# Patient Record
Sex: Male | Born: 1955 | Race: White | Hispanic: No | Marital: Married | Smoking: Never smoker
Health system: Southern US, Community
[De-identification: ages and names within clinical notes are randomized; demographics above are authoritative.]

## PROBLEM LIST (undated history)

## (undated) DIAGNOSIS — N200 Calculus of kidney: Secondary | ICD-10-CM

## (undated) DIAGNOSIS — E119 Type 2 diabetes mellitus without complications: Secondary | ICD-10-CM

## (undated) HISTORY — PX: KNEE SURGERY: SHX244

## (undated) HISTORY — PX: HERNIA REPAIR: SHX51

## (undated) HISTORY — PX: CHOLECYSTECTOMY: SHX55

---

## 1998-01-02 ENCOUNTER — Encounter: Payer: Self-pay | Admitting: Emergency Medicine

## 1998-01-02 ENCOUNTER — Emergency Department (HOSPITAL_COMMUNITY): Admission: EM | Admit: 1998-01-02 | Discharge: 1998-01-02 | Payer: Self-pay | Admitting: Emergency Medicine

## 1998-01-05 ENCOUNTER — Ambulatory Visit (HOSPITAL_COMMUNITY): Admission: RE | Admit: 1998-01-05 | Discharge: 1998-01-06 | Payer: Self-pay | Admitting: General Surgery

## 2005-06-06 ENCOUNTER — Encounter: Payer: Self-pay | Admitting: General Surgery

## 2005-08-16 ENCOUNTER — Ambulatory Visit: Payer: Self-pay | Admitting: Internal Medicine

## 2005-08-30 ENCOUNTER — Ambulatory Visit: Payer: Self-pay | Admitting: Internal Medicine

## 2007-03-08 ENCOUNTER — Observation Stay (HOSPITAL_COMMUNITY): Admission: EM | Admit: 2007-03-08 | Discharge: 2007-03-08 | Payer: Self-pay | Admitting: Emergency Medicine

## 2010-06-20 NOTE — Discharge Summary (Signed)
NAME:  Timothy [name], Timothy [name] NO.:  [id number]   MEDICAL RECORD NO.:  [id number]          PATIENT TYPE:  INP   LOCATION:  3705                         FACILITY:  [facility]   PHYSICIAN:  Timothy [name], M.D.   DATE OF BIRTH:  [birth year]   DATE OF ADMISSION:  03/08/2007  DATE OF DISCHARGE:  03/08/2007                               DISCHARGE SUMMARY   A 55 year old patient who was admitted at 3:30 in the morning on March 08, 2007 for observation secondary to neck pain.  He had presented to  [facility] with neck pain.  The symptoms actually began on Thursday, March 06, 2007.  It awakened him at midnight, though, on March 08, 2007.  It  lasted to 10-15 minutes.  The neck pain abated spontaneously.  It again  happened lying down.  EMS was called.  Aspirin, sublingual nitroglycerin  given with relief of pain.  He was seen at [facility] on Friday and was  scheduled to see Dr. Nanetta [name] at [facility] on March 10, 2007  for complete evaluation.  He had no radiation with his discomfort.  No  shortness of breath, nausea, or diaphoresis.  He exercises regularly and  has noted increased shortness of breath with activity.  No other pain.   PAST MEDICAL HISTORY:  Negative.   FAMILY HISTORY:  Negative for coronary disease.   ALLERGIES:  NONE.   OUTPATIENT MEDICATIONS:  None.   SOCIAL HISTORY:  He is married.  His wife is a Web designer.  He has one son.  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  No tobacco, no alcohol use.   REVIEW OF SYSTEMS:  Except for neck pain, were negative.   PHYSICAL EXAMINATION AT DISCHARGE:  VITAL SIGNS:  Blood pressure 116/70,  pulse 75, respiratory rate 20, afebrile.  HEART:  S1 and S2.  Regular rate and rhythm without S3 gallop.  LUNGS:  Clear to auscultation bilaterally.  NEUROLOGIC:  Alert and oriented x3.  EXTREMITIES:  Without edema.   LABORATORY DATA:  Hemoglobin 15.9, hematocrit 46.08, platelets 294, WBC  6.7.   PTT 26, pro time 13.4, INR 1.  Cholesterol panel:  Total  cholesterol 220, triglycerides 132, HDL 55, LDL 139.  Sodium 139,  potassium 4.2, chloride 108, CO2 23, glucose 129, BUN 13, creatinine  0.92.  Bilirubin 0.8, alkaline phosphatase 49, SGOT 27, SGPT 33, calcium  8.8.  CK 117, MB 1.9, troponin I less than 0.01.  Initial markers were  all negative as well x2.  Magnesium 2.3.  TSH 0.995.  All negative for  MI.   Chest x-ray:  Results are not available currently.  EKG is without acute  changes, sinus rhythm.  He did have a first-degree block.  TR was 213  milliseconds.   HOSPITAL COURSE:  The patient was brought in for observation.  He was  kept overnight at [facility].  Cardiac enzymes were negative.  He did have  chest pain.  It had resolved, and he was stable at the time of  discharge.  Plans will be for Persantine Myoview  on Monday morning.   DISCHARGE MEDICATIONS:  Aspirin 325 mg daily.   DISCHARGE INSTRUCTIONS:  1. No work until March 11, 2007.  2. Increase activity slowly.  3. Low sodium, heart healthy diet.  4. See Dr. Allyson [name] on Monday.  The office will call the patient and      schedule the Persantine Myoview early morning on March 10, 2007.      He is to have nothing to drink after midnight on March 09, 2007      for test Monday.  No caffeine or milk products until after the      test.  The patient and wife aware of instructions.  The patient was      discharged home without further complications.      Timothy [name]. Timothy [name], N.P.      Timothy [name], M.D.  Electronically Signed    LRI/MEDQ  D:  03/08/2007  T:  03/09/2007  Job:  [id number]   cc:   Timothy [name], M.D.

## 2010-10-26 LAB — CBC
HCT: 47
Hemoglobin: 15.9
MCHC: 33.9
MCHC: 34.7
MCV: 88.1
MCV: 88.7
Platelets: 291
RBC: 5.31
RDW: 12.9
RDW: 13.1
WBC: 7.2

## 2010-10-26 LAB — POCT CARDIAC MARKERS
CKMB, poc: <1 — ABNORMAL LOW
CKMB, poc: <1 — ABNORMAL LOW
Myoglobin, poc: 69.1
Operator id: 282201
Troponin i, poc: <0.05

## 2010-10-26 LAB — I-STAT 8, (EC8 V) (CONVERTED LAB)
Acid-base deficit: 2
BUN: 16
Bicarbonate: 25.1 — ABNORMAL HIGH
Chloride: 108
Glucose, Bld: 122 — ABNORMAL HIGH
HCT: 48
Hemoglobin: 16.3
Operator id: 282201
Potassium: 4.8
Sodium: 140
TCO2: 27
pCO2, Ven: 48.6
pH, Ven: 7.322 — ABNORMAL HIGH

## 2010-10-26 LAB — COMPREHENSIVE METABOLIC PANEL
Alkaline Phosphatase: 49
BUN: 13
CO2: 23
Calcium: 8.8
GFR calc non Af Amer: >60
Glucose, Bld: 129 — ABNORMAL HIGH
Potassium: 4.2
Total Protein: 6.7

## 2010-10-26 LAB — CK TOTAL AND CKMB (NOT AT ARMC)
CK, MB: 1.9
Total CK: 117

## 2010-10-26 LAB — LIPID PANEL
HDL: 55
Total CHOL/HDL Ratio: 4
Triglycerides: 132
VLDL: 26

## 2010-10-26 LAB — DIFFERENTIAL
Basophils Relative: 1
Eosinophils Absolute: 0.1
Monocytes Relative: 4
Neutro Abs: 5.1
Neutrophils Relative %: 76

## 2010-10-26 LAB — PROTIME-INR
INR: 1
Prothrombin Time: 13.4

## 2010-10-26 LAB — POCT I-STAT CREATININE
Creatinine, Ser: 1.3
Operator id: 282201

## 2010-10-26 LAB — TSH: TSH: 0.995

## 2010-10-26 LAB — MAGNESIUM: Magnesium: 2.3

## 2012-09-12 ENCOUNTER — Emergency Department (HOSPITAL_BASED_OUTPATIENT_CLINIC_OR_DEPARTMENT_OTHER): Payer: BC Managed Care – PPO

## 2012-09-12 ENCOUNTER — Encounter (HOSPITAL_BASED_OUTPATIENT_CLINIC_OR_DEPARTMENT_OTHER): Payer: Self-pay | Admitting: Emergency Medicine

## 2012-09-12 ENCOUNTER — Emergency Department (HOSPITAL_BASED_OUTPATIENT_CLINIC_OR_DEPARTMENT_OTHER)
Admission: EM | Admit: 2012-09-12 | Discharge: 2012-09-13 | Disposition: A | Discharge status: Home / Self Care | Payer: BC Managed Care – PPO | Attending: Emergency Medicine | Admitting: Emergency Medicine

## 2012-09-12 DIAGNOSIS — R319 Hematuria, unspecified: Secondary | ICD-10-CM | POA: Insufficient documentation

## 2012-09-12 DIAGNOSIS — N1330 Unspecified hydronephrosis: Secondary | ICD-10-CM

## 2012-09-12 DIAGNOSIS — R112 Nausea with vomiting, unspecified: Secondary | ICD-10-CM | POA: Insufficient documentation

## 2012-09-12 DIAGNOSIS — N200 Calculus of kidney: Secondary | ICD-10-CM

## 2012-09-12 HISTORY — DX: Calculus of kidney: N20.0

## 2012-09-12 LAB — CBC WITH DIFFERENTIAL/PLATELET
Basophils Absolute: 0.1 10*3/uL (ref 0.0–0.1)
Basophils Relative: 1 % (ref 0–1)
Eosinophils Relative: 4 % (ref 0–5)
HCT: 46.8 % (ref 39.0–52.0)
Hemoglobin: 16.2 g/dL (ref 13.0–17.0)
MCH: 29.6 pg (ref 26.0–34.0)
MCHC: 34.6 g/dL (ref 30.0–36.0)
MCV: 85.4 fL (ref 78.0–100.0)
Monocytes Absolute: 0.7 10*3/uL (ref 0.1–1.0)
Monocytes Relative: 10 % (ref 3–12)
Neutro Abs: 4.4 10*3/uL (ref 1.7–7.7)
RDW: 12.4 % (ref 11.5–15.5)

## 2012-09-12 LAB — COMPREHENSIVE METABOLIC PANEL
AST: 18 U/L (ref 0–37)
Albumin: 4 g/dL (ref 3.5–5.2)
BUN: 18 mg/dL (ref 6–23)
Calcium: 9.7 mg/dL (ref 8.4–10.5)
Chloride: 100 mEq/L (ref 96–112)
Creatinine, Ser: 1.1 mg/dL (ref 0.50–1.35)
GFR calc non Af Amer: 73 mL/min — ABNORMAL LOW (ref >90)
Total Bilirubin: 0.3 mg/dL (ref 0.3–1.2)

## 2012-09-12 LAB — URINALYSIS, ROUTINE W REFLEX MICROSCOPIC
Glucose, UA: NEGATIVE mg/dL
Ketones, ur: NEGATIVE mg/dL
Leukocytes, UA: NEGATIVE
Nitrite: NEGATIVE
Protein, ur: NEGATIVE mg/dL
Urobilinogen, UA: 0.2 mg/dL (ref 0.0–1.0)

## 2012-09-12 LAB — URINE MICROSCOPIC-ADD ON

## 2012-09-12 MED ORDER — ONDANSETRON HCL 4 MG/2ML IJ SOLN
4.0000 mg | Freq: Once | INTRAMUSCULAR | Status: AC
  Administered 2012-09-12: 4 mg via INTRAVENOUS
  Filled 2012-09-12: qty 2

## 2012-09-12 MED ORDER — HYDROMORPHONE HCL PF 1 MG/ML IJ SOLN
1.0000 mg | Freq: Once | INTRAMUSCULAR | Status: AC
  Administered 2012-09-12: 1 mg via INTRAVENOUS
  Filled 2012-09-12: qty 1

## 2012-09-12 NOTE — ED Notes (Signed)
Severe right flank pain x3 hrs.  Sts he has known it was coming on for 2-3 weeks.  Sts he has had 40-50 kidney stones and generally passes it himself.  Occasionally he has one "like this and I need some help."    Nausea and vomiting.  Unable to sit.

## 2012-09-12 NOTE — ED Notes (Signed)
Pt states has multiple kidney stones every year. States currently 10/10 pain, vomited x3 today.

## 2012-09-12 NOTE — ED Provider Notes (Signed)
CSN: [id number]     Arrival date & time 09/12/12  2120 History     First MD Initiated Contact with Patient 09/12/12 2152     Chief Complaint  Patient presents with  . Flank Pain   (Consider location/radiation/quality/duration/timing/severity/associated sxs/prior Treatment) The history is provided by the patient and the spouse. No language interpreter was used.  Timothy [name] is a 57 y/o M with PMHx of kidney stones presenting to the emergency department with right-sided flank pain has been ongoing for the past 2-3 weeks, but severe pain began approximately 3 hours ago patient described discomfort to be a deep high-level pain starting at the right flank and radiating to the abdomen towards the suprapubic region into the groin. Stated that he knew this pain was coming for a couple weeks, reported that he has seen blood in his urine which is a sign that he is passing kidney stones-as per patient. Patient reported that certain positions make the pain worse, stated that laying on his back flat makes the pain better. Patient reported that he was taking OxyContin at home with negative relief. Patient reported that he has been feeling nauseous, stated he had at least 7 episodes of emesis today-mainly of food and liquids, non-bloody and non-bilious. Patient reported that he has had a history of kidney stones since he was 57 years old. Stated that he was seen by urology many years ago, and denied any surgeries for kidney stones. Stated that he had a cystoscopy performed many years ago, when he was in his 68's, stated that there were negative findings noted denied fever, chills, neck pain, back pain, diarrhea, headache, dizziness, numbness, tingling.  Past Medical History  Diagnosis Date  . Kidney stones    Past Surgical History  Procedure Laterality Date  . Cholecystectomy    . Hernia repair    . Knee surgery     No family history on file. History  Substance Use Topics  . Smoking status: Never Smoker    . Smokeless tobacco: Never Used  . Alcohol Use: Yes     Comment: occ    Review of Systems  Constitutional: Negative for fever and chills.  HENT: Negative for neck pain and neck stiffness.   Respiratory: Negative for cough, chest tightness and shortness of breath.   Cardiovascular: Negative for chest pain.  Gastrointestinal: Positive for nausea and vomiting. Negative for abdominal pain.  Genitourinary: Positive for hematuria and flank pain (right sided). Negative for difficulty urinating.  Neurological: Negative for dizziness, weakness, numbness and headaches.  All other systems reviewed and are negative.    Allergies  Review of patient's allergies indicates no known allergies.  Home Medications   Current Outpatient Rx  Name  Route  Sig  Dispense  Refill  . ondansetron (ZOFRAN) 4 MG tablet   Oral   Take 1 tablet (4 mg total) by mouth every 6 (six) hours.   12 tablet   0   . oxyCODONE-acetaminophen (PERCOCET) 5-325 MG per tablet   Oral   Take 1 tablet by mouth every 8 (eight) hours as needed for pain.   11 tablet   0    BP 120/67  Pulse 81  Temp(Src) 97.7 F (36.5 C) (Oral)  Resp 16  Ht 5\' 10"  (1.778 m)  Wt 225 lb (102.059 kg)  BMI 32.28 kg/m2  SpO2 95% Physical Exam  Nursing note and vitals reviewed. Constitutional: He is oriented to person, place, and time. He appears well-developed and well-nourished. No distress.  HENT:  Head: Normocephalic and atraumatic.  Mouth/Throat: Oropharynx is clear and moist. No oropharyngeal exudate.  Eyes: Conjunctivae and EOM are normal. Pupils are equal, round, and reactive to light. Right eye exhibits no discharge. Left eye exhibits no discharge.  Neck: Normal range of motion. Neck supple.  Negative neck stiffness Negative nuchal rigidity  Cardiovascular: Normal rate, regular rhythm and normal heart sounds.  Exam reveals no friction rub.   No murmur heard. Pulses:      Radial pulses are 2+ on the right side, and 2+ on the  left side.       Dorsalis pedis pulses are 2+ on the right side, and 2+ on the left side.  Pulmonary/Chest: Effort normal and breath sounds normal. No respiratory distress. He has no wheezes. He has no rales.  Abdominal: Soft. Bowel sounds are normal. He exhibits no distension. There is tenderness (suprapubic pressure). There is no rebound and no guarding.  Right sided CVA tenderness  Lymphadenopathy:    He has no cervical adenopathy.  Neurological: He is alert and oriented to person, place, and time. He exhibits normal muscle tone. Coordination normal.  Skin: Skin is warm and dry. No rash noted. He is not diaphoretic. No erythema.  Psychiatric: He has a normal mood and affect. His behavior is normal. Thought content normal.    ED Course   Procedures (including critical care time)  Upon arrival to the emergency department, patient was reported to be in renal colic. Nurse came over to me and reported the patient is in intense pain, 10 out of 10, reported the patient was crying do to discomfort. Pain medications ordered and administered for pain relief.  Medications  HYDROmorphone (DILAUDID) injection 1 mg (1 mg Intravenous Given 09/12/12 2202)  ondansetron (ZOFRAN) injection 4 mg (4 mg Intravenous Given 09/12/12 2202)  HYDROmorphone (DILAUDID) injection 1 mg (1 mg Intravenous Given 09/13/12 0026)  ondansetron (ZOFRAN) injection 4 mg (4 mg Intravenous Given 09/13/12 0024)    Labs Reviewed  URINALYSIS, ROUTINE W REFLEX MICROSCOPIC - Abnormal; Notable for the following:    Hgb urine dipstick LARGE (*)    All other components within normal limits  COMPREHENSIVE METABOLIC PANEL - Abnormal; Notable for the following:    Glucose, Bld 186 (*)    GFR calc non Af Amer 73 (*)    GFR calc Af Amer 85 (*)    All other components within normal limits  URINE MICROSCOPIC-ADD ON  CBC WITH DIFFERENTIAL   Ct Abdomen Pelvis Wo Contrast  09/12/2012   *RADIOLOGY REPORT*  Clinical Data: Severe right flank pain  for 3 hours.  Nausea and vomiting.  CT ABDOMEN AND PELVIS WITHOUT CONTRAST  Technique:  Multidetector CT imaging of the abdomen and pelvis was performed following the standard protocol without intravenous contrast.  Comparison: CT of the abdomen and pelvis performed 06/28/2005  Findings: Minimal bibasilar atelectasis is noted.  The liver and spleen are unremarkable in appearance.  The gallbladder is within normal limits.  The pancreas and adrenal glands are unremarkable.  There is mild right-sided hydronephrosis, with prominence of the right ureter to the level of an obstructing 5 x 4 mm stone in the mid right ureter.  Right-sided perinephric stranding and fluid are seen.  Additional scattered small left-sided renal stones are seen, measuring up to 4 mm in size.  The left kidney is otherwise unremarkable in appearance.  No free fluid is identified.  The small bowel is unremarkable in appearance.  The stomach is within  normal limits.  No acute vascular abnormalities are seen.  The appendix is normal in caliber and contains air, without evidence for appendicitis.  The colon is unremarkable in appearance.  The bladder is mildly distended and grossly unremarkable.  The prostate remains normal in size, with minimal calcification.  Small bilateral inguinal hernias are seen, containing only fat.  No inguinal lymphadenopathy is seen.  No acute osseous abnormalities are identified.  IMPRESSION:  1.  Mild right-sided hydronephrosis, with an obstructing 5 x 4 mm stone in the mid right ureter. 2.  Scattered small left-sided nonobstructing renal stones seen, measuring up to 4 mm in size. 3.  Small bilateral inguinal hernias, containing only fat.   Original Report Authenticated By: Tonia Ghent, M.D.   1. Nephrolithiasis   2. Hydronephrosis, right     MDM  Patient presenting to the emergency department with right-sided flank pain. Patient has a strong history of kidney stones, is not currently being seen by a urologist.  Denied any form of surgical procedures. Upon arrival to the emergency department patient was in renal colic with intense nausea and at least one episode of emesis in the emergency department-patient was unable to sit still. Negative acute abdomen, negative peritoneal sign mild discomfort upon palpation to the suprapubic region. Negative CVA tenderness, bilaterally. Patient calm and collected during physical exam. CBC negative findings, negative elevation in WBC. CMP negative findings. Urinalysis positive for large hemoglobin noted-negative elevation in WBC, doubt pyelonephritis. CT abdomen performed-obstructing stone in the mid right ureter with mild hydronephrosis noted, scattered small nonobstructing renal stones in the left kidney noted. Pain controlled in ED setting, patient rated pain 2/10 prior to discharge-patient calm, laying down relaxed in bed. Patient stable, afebrile-no elevation in WBC, patient is not septic. Patient discharged. Discharge patient with anti-emetics and pain medication-discussed with patient precautions, course, disposal technique. Referred patient to urology, instructed patient to contact urology first thing Monday morning-distress this importance. Discussed with the patient to rest and stay hydrated. Patient to closely monitor symptoms, instructed that if pain is to increase in any way increase in nausea and vomiting patient is to return to the emergency department immediately. Discussed with patient to continue to monitor symptoms closely and if symptoms are to worsen or change to report back to the emergency department-strict return instructions given. Patient agreed to plan of care, understood, all questions answered.  Raymon Mutton, PA-C 09/13/12 0206

## 2012-09-13 MED ORDER — OXYCODONE-ACETAMINOPHEN 5-325 MG PO TABS: 1.0000 | ORAL_TABLET | Freq: Three times a day (TID) | ORAL | Status: DC | PRN

## 2012-09-13 MED ORDER — ONDANSETRON HCL 4 MG PO TABS: 4.0000 mg | ORAL_TABLET | Freq: Four times a day (QID) | ORAL | Status: DC

## 2012-09-13 MED ORDER — HYDROMORPHONE HCL PF 1 MG/ML IJ SOLN
1.0000 mg | Freq: Once | INTRAMUSCULAR | Status: AC
  Administered 2012-09-13: 1 mg via INTRAVENOUS
  Filled 2012-09-13: qty 1

## 2012-09-13 MED ORDER — ONDANSETRON HCL 4 MG/2ML IJ SOLN
4.0000 mg | Freq: Once | INTRAMUSCULAR | Status: AC
  Administered 2012-09-13: 4 mg via INTRAVENOUS
  Filled 2012-09-13: qty 2

## 2012-09-13 NOTE — ED Notes (Signed)
PA at bedside giving test results and detailed plan of care.

## 2012-09-17 NOTE — ED Provider Notes (Signed)
Shared service with midlevel provider. I have personally seen and examined the patient, providing direct face to face care, presenting with the chief complaint of abdominal pain. Physical exam findings include non peritoneal abdomen. Pt has a non obstructive ureteral stone, it is large. Pain controlled. Plan will be to get urgent f/u, return precautions discussed by the team. I have reviewed the nursing documentation on past medical history, family history, and social history.  Derwood Kaplan, MD 09/17/12 1217

## 2012-11-17 ENCOUNTER — Ambulatory Visit (INDEPENDENT_AMBULATORY_CARE_PROVIDER_SITE_OTHER): Payer: BC Managed Care – PPO | Admitting: Sports Medicine

## 2012-11-17 ENCOUNTER — Encounter (INDEPENDENT_AMBULATORY_CARE_PROVIDER_SITE_OTHER): Payer: Self-pay

## 2012-11-17 ENCOUNTER — Encounter: Payer: Self-pay | Admitting: Sports Medicine

## 2012-11-17 VITALS — BP 140/90 | Ht 70.0 in | Wt 220.0 lb

## 2012-11-17 DIAGNOSIS — M7660 Achilles tendinitis, unspecified leg: Secondary | ICD-10-CM

## 2012-11-17 MED ORDER — NITROGLYCERIN 0.2 MG/HR TD PT24: MEDICATED_PATCH | TRANSDERMAL | Status: DC

## 2012-11-17 NOTE — Patient Instructions (Signed)
Nitroglycerin Protocol   Apply 1/4 nitroglycerin patch to affected area daily.  Change position of patch within the affected area every 24 hours.  You may experience a headache during the first 1-2 weeks of using the patch, these should subside.  If you experience headaches after beginning nitroglycerin patch treatment, you may take your preferred over the counter pain reliever.  Another side effect of the nitroglycerin patch is skin irritation or rash related to patch adhesive.  Please notify our office if you develop more severe headaches or rash, and stop the patch.  Tendon healing with nitroglycerin patch may require 12 to 24 weeks depending on the extent of injury.  Men should not use if taking Viagra, Cialis, or Levitra.   Do not use if you have migraines or rosacea.   Please follow up in 6 weeks  Thank you for seeing [place] today!

## 2012-11-18 NOTE — Progress Notes (Signed)
  Subjective:    Patient ID: Timothy [name], male    DOB: Jul 14, 1955, 57 y.o.   MRN: [id number]  HPI chief complaint: Right Achilles pain  Very pleasant 57 year old male comes in today complaining of 4 months of right Achilles pain and swelling. His symptoms began acutely while on a hike. He does not recall a pop and has been able to ambulate but has been experiencing intermittent pain and swelling ever since particularly with activity such as climbing or hiking. He localizes all of his pain to the posterior ankle at the Achilles tendon. Some radiating discomfort into the calf as well. Similar pain in the left Achilles but not nearly as pronounced as what he's experiencing in the right. No problems with his Achilles in the past. He has been taking intermittent doses of over-the-counter anti-inflammatories. His swelling is intermittent and worse with activity. No numbness and tingling. Symptoms improve at rest. No prior foot or ankle surgeries.  Medical history and current medications are reviewed No known drug allergies He does not smoke and drinks alcohol on occasional basis.    Review of Systems     Objective:   Physical Exam Well-developed, well-nourished. No acute distress. Vital signs are reviewed. Awake alert and oriented x3  Right ankle: There is tenderness to palpation and as well as mild swelling along the Achilles tendon just proximal to its insertion on the calcaneus. No palpable defect. Excellent plantar flexion with Thompson's testing. Good strength with resisted plantarflexion. No real pain with Achilles stretch. No tenderness to palpation along his calf. Ankle is stable with no effusion. Neurovascularly intact distally.  Left ankle: Mild tenderness to palpation along the Achilles tendon but not markedly. No palpable swelling. No palpable defect. Excellent plantar flexion with Thompson's testing. Good strength with resisted plantarflexion. Neurovascularly intact distally.  Patient  is walking without a significant limp.  MSK ultrasound of each Achilles tendon was performed. Images were obtained in both long and short view. The right Achilles tendon shows marked hypoechogenicity at the Achilles tendon proximal to the insertion on the calcaneus. This is appreciated both the long and short view. I believe that there is some longitudinal tearing of the tendon. Poor neovascularity. No significant spur formation or calcific tendinopathy is seen. Achilles tendon measures 0.88cm in the [place]. Left Achilles tendon shows some hypoechoic changes along the tendon sheath consistent with some inflammation here but I do not see any definitive tearing. It measures 0.78cm on the [place].       Assessment & Plan:  Bilateral Achilles pain secondary to Achilles tendinopathy, right greater than left  Patient will start the topical nitroglycerin protocol applying one quarter patch daily to the more symptomatic right Achilles. I've also given him a body helix compression sleeve for the right ankle and I've given him bilateral 5/16 inch lift for both feet. He will start an eccentric heel drop home exercise program and followup with me in 6 weeks for reevaluation and repeat ultrasound. He is cautioned about hiking and climbing exacerbating his symptoms. He will call with questions or concerns prior to his followup visit.

## 2012-12-11 ENCOUNTER — Other Ambulatory Visit: Payer: Self-pay

## 2012-12-22 ENCOUNTER — Encounter: Payer: Self-pay | Admitting: Sports Medicine

## 2012-12-22 ENCOUNTER — Ambulatory Visit (INDEPENDENT_AMBULATORY_CARE_PROVIDER_SITE_OTHER): Payer: BC Managed Care – PPO | Admitting: Sports Medicine

## 2012-12-22 VITALS — BP 150/90 | Ht 70.0 in | Wt 220.0 lb

## 2012-12-22 DIAGNOSIS — M7660 Achilles tendinitis, unspecified leg: Secondary | ICD-10-CM

## 2012-12-22 MED ORDER — NITROGLYCERIN 0.2 MG/HR TD PT24: MEDICATED_PATCH | TRANSDERMAL | Status: DC

## 2012-12-22 NOTE — Progress Notes (Signed)
  Subjective:    Patient ID: Timothy [name], male    DOB: 09-25-1955, 57 y.o.   MRN: [id number]  HPI Patient is a 57 yo male who presents in follow-up for bilateral achilles tendonitis. Last seen 11/18/12 for this issue. Notes he has been improving on the nitro patches with use on the right side. He states he is stiff in the mornings, though feels improved as the day progresses. Has been tolerating the nitro patches and is doing the exercises provided at the last visit. Notes he feels 30-40% improved. Now notes that his left achilles may feel worse than the right. He is doing 3-5 miles of walking a day.    Review of Systems see HPI     Objective:   Physical Exam Well nourished, well developed  Right ankle: mild swelling noted in the distal achilles prior to the insertion site, there is point tenderness in this area as well, he exhibits full range of motion in ankle, no tenderness along his calf, 5/5 strength on plantar flexion, dorsiflexion, inversion, and eversion, neurovascularly intact, no apparent effusion  Left ankle: no apparent swelling noted in the distal achilles prior to the insertion site, there is point tenderness in this area though, he exhibits full range of motion in ankle, no tenderness along his calf, 5/5 strength on plantar flexion, dorsiflexion, inversion, and eversion, neurovascularly intact, no apparent effusion  MSK ultrasound of bilateral achilles tendons were performed. Images obtained in both long and short views. The right achilles tendon was noted to have a thickness of 0.92 cm at the distal portion of the achilles prior to the insertion site, there is an area of hypoechogenicity in the area of thickening, tearing is less apparent compared to previous ultrasound. Left achilles tendon shows hypoechoic changes with thickness of 0.82 cm in the distal portion prior to the insertion at the calcaneus, in the short view there are a few small areas of tearing.     Assessment &  Plan:  Patient with bilateral achilles tendonopathy  Patient to continue topical nitroglycerin protocol applying 1/4 patch to his right achilles daily. In addition he is to start this protocol on the left as well. He is to continue his eccentric exercises and to use the heel lifts for his shoes. Discussed that hiking and walking could exacerbate his symptoms. Patient is to follow-up right before the holidays as he is heading out of town for a month and a half right after the holidays.

## 2012-12-22 NOTE — Patient Instructions (Signed)
Nice to see you. Please use 1/4 of a nitroglycerin patch on both your achilles tendons. Continue to do your exercises. We will see you back right before the holidays.

## 2013-01-22 ENCOUNTER — Ambulatory Visit: Payer: BC Managed Care – PPO | Admitting: Sports Medicine

## 2013-02-18 ENCOUNTER — Encounter: Payer: Self-pay | Admitting: Sports Medicine

## 2013-02-18 ENCOUNTER — Ambulatory Visit (INDEPENDENT_AMBULATORY_CARE_PROVIDER_SITE_OTHER): Payer: BC Managed Care – PPO | Admitting: Sports Medicine

## 2013-02-18 VITALS — BP 145/90 | HR 71 | Ht 70.0 in | Wt 220.0 lb

## 2013-02-18 DIAGNOSIS — M7660 Achilles tendinitis, unspecified leg: Secondary | ICD-10-CM

## 2013-02-18 DIAGNOSIS — M7662 Achilles tendinitis, left leg: Secondary | ICD-10-CM

## 2013-02-18 DIAGNOSIS — M7661 Achilles tendinitis, right leg: Secondary | ICD-10-CM

## 2013-02-18 NOTE — Progress Notes (Signed)
   Subjective:    Patient ID: Timothy [name], male    DOB: 12/16/1955, 58 y.o.   MRN: [id number]  HPI Patient comes in today for followup on bilateral Achilles tendinopathy. Overall, he continues to improve. Left heel is practically asymptomatic. Right heel is improving. Still some stiffness in the morning but he has returned to all activities including hiking. He continues to use his nitroglycerin on both Achilles tendons. Tolerating this fairly well. Overall, he is pleased with his progress to date. Unfortunately, his mother recently passed away unexpectedly and he admittedly was unable to do his exercises or use his nitroglycerin but he has now resumed both of these.    Review of Systems     Objective:   Physical Exam Well-developed, no acute distress  Right heel: There is still some palpable thickening at the mid substance of the Achilles tendon but it is nontender to palpation. Left heel: Mild thickening of the Achilles tendon but again no palpable tenderness. Walking without a limp.  MSK ultrasound: Quick scan of each Achilles tendon still shows some thickening of each tendon, right greater than left. Overall, measuring a little less in thickness than on his previous scans.       Assessment & Plan:  Improving bilateral Achilles tendinopathy  Continue with topical nitroglycerin protocol on both Achilles tendons. Continue with eccentric heel drops. He is going to take a job at [employer] in May and I like to see him back in the office prior to his trip. We will repeat his ultrasound at that time. Although he continues to show thickening of the tendon, he is clinically much improved.  Patient is now about 3 months into treatment

## 2014-02-16 ENCOUNTER — Ambulatory Visit (INDEPENDENT_AMBULATORY_CARE_PROVIDER_SITE_OTHER): Payer: BLUE CROSS/BLUE SHIELD | Admitting: Sports Medicine

## 2014-02-16 ENCOUNTER — Encounter: Payer: Self-pay | Admitting: Sports Medicine

## 2014-02-16 VITALS — BP 131/86 | HR 73 | Ht 70.0 in | Wt 210.0 lb

## 2014-02-16 DIAGNOSIS — M7661 Achilles tendinitis, right leg: Secondary | ICD-10-CM | POA: Diagnosis not present

## 2014-02-16 DIAGNOSIS — M7662 Achilles tendinitis, left leg: Secondary | ICD-10-CM

## 2014-02-16 NOTE — Progress Notes (Signed)
Subjective:    Patient ID: Timothy [name], male    DOB: 06/22/1955, 59 y.o.   MRN: [id number]  HPI  Timothy [name] is a 59 yo male who presents with recurrent, bilateral achilles tenderness. He was last seen approximately one year ago for bilateral Achilles tendinopathy. At that time, he completed a three month course of topical nitroglycerin and heel drop exercises. This improved his pain. Shortly after this, he went out to the [employer] for six months where he worked for the [employer] During this time, he was walking 5-10 miles per day. Three months ago he resumed  the heel drop exercises when his pain started to return. He has continued to have bilateral pain approximately 1 cm above the Achilles insertion into the calcaneus, worse on the right. His right ankle has also been swelling around the Achilles and along the posterior border of the medial malleolus. Yesterday, he also reports having some associated crampy, sharp right calf pain while walking his dog. No numbness or tingling. No acute, specific injuries or associated discoloration.   Timothy [name] also describes bilateral shoulder pain and knee pain in conjunction with his ankle pain. His wife has been diagnosed with Lyme disease. Timothy [name] reports that all of this joint pain seems to have started around the same time and is concerned that he may have had an exposure. His wife sees a physician in [place], [place] for her Lyme disease and Timothy [name] plans to discuss this with him at their next appointment. He declined any further evaluation in clinic today.   Review of Systems Negative other than noted in HPI.     Objective:   Physical Exam Vitals: BP 131/86 P 73 General: well-appearing, pleasant male in no acute distress  Right Ankle:  Inspection: Increased Achilles thickness approximately 1 cm superior to the Achilles/Calcaneal insertion, mild swelling between the medial border of the Achilles and the posterior border of the medial  malleolus, no discoloration Palpation: tender to palpation approximately 1 cm superior to the Achilles/Calcaneal insertion. No tenderness to palpation along the medial or lateral malleoli.   ROM: full ROM in all planes Strength: 5/5 with flexion, extension, eversion, and inversion Special Testing: neg anterior drawer test, mild amount of laxity with talar tilt testing Neurovascular: neurovascularly intact distally  Left Ankle:  Inspection: Increased Achilles thickness along the Achilles/Calcaneal insertion, no swelling or discoloration Palpation: tender to palpation approximately 1 cm superior to the Achilles/Calcaneal insertion. No tenderness to palpation along the medial or lateral malleoli.   ROM: full ROM in all planes Strength: 5/5 with flexion, extension, eversion, and inversion Special Testing: neg anterior drawer test, neg talar tilt testing Neurovascular: neurovascularly intact distally  Ultrasound Examination:  Right Achilles tendon viewed along long and short axis. Right Achilles tendon continues to be thickened, measuring 0.96cm on long axis view. No obvious tears.    Assessment & Plan:   1. Bilateral Achilles Tendinopathy:  Given Timothy [name] high level of activity and previous Achilles Tendinopathy with Achilles tendon thickening, this seems to be an acute on chronic exacerbation. The Achilles Tendinopathy has likely acutely flared causing the pain.   - Discussed continuing the heel drop exercises - Restarted topical nitroglycerin protocol - Will follow-up in 4 weeks  2. Sharp, crampy calf pain:  Timothy [name] has had heel lifts in his shoes in the past and is currently using an OTC support. This coupled with the restarted of his heel drop exercises may have initiated this  pain.   - Provided pt with 5/16" heel lifts (2 sets) - Will follow-up in 4 weeks   This patient was seen with and note was dictated by Gorden HarmsMegan Campbell, MS4 [facility]

## 2014-02-23 ENCOUNTER — Other Ambulatory Visit: Payer: Self-pay | Admitting: *Deleted

## 2014-02-23 MED ORDER — NITROGLYCERIN 0.2 MG/HR TD PT24: MEDICATED_PATCH | TRANSDERMAL | Status: DC

## 2015-07-26 ENCOUNTER — Encounter: Payer: Self-pay | Admitting: Gastroenterology

## 2015-12-15 DIAGNOSIS — L02821 Furuncle of head [any part, except face]: Secondary | ICD-10-CM | POA: Diagnosis not present

## 2015-12-15 DIAGNOSIS — B9689 Other specified bacterial agents as the cause of diseases classified elsewhere: Secondary | ICD-10-CM | POA: Diagnosis not present

## 2016-02-13 DIAGNOSIS — E119 Type 2 diabetes mellitus without complications: Secondary | ICD-10-CM | POA: Diagnosis not present

## 2016-02-13 DIAGNOSIS — D3131 Benign neoplasm of right choroid: Secondary | ICD-10-CM | POA: Diagnosis not present

## 2016-02-13 DIAGNOSIS — H2513 Age-related nuclear cataract, bilateral: Secondary | ICD-10-CM | POA: Diagnosis not present

## 2016-02-13 DIAGNOSIS — Z7984 Long term (current) use of oral hypoglycemic drugs: Secondary | ICD-10-CM | POA: Diagnosis not present

## 2016-04-11 DIAGNOSIS — Z1211 Encounter for screening for malignant neoplasm of colon: Secondary | ICD-10-CM | POA: Diagnosis not present

## 2016-04-11 DIAGNOSIS — E119 Type 2 diabetes mellitus without complications: Secondary | ICD-10-CM | POA: Diagnosis not present

## 2016-12-10 ENCOUNTER — Ambulatory Visit: Payer: Self-pay

## 2016-12-10 ENCOUNTER — Ambulatory Visit: Payer: BLUE CROSS/BLUE SHIELD | Admitting: Sports Medicine

## 2016-12-10 ENCOUNTER — Encounter: Payer: Self-pay | Admitting: Sports Medicine

## 2016-12-10 VITALS — BP 140/88 | HR 71 | Ht 70.0 in | Wt 220.4 lb

## 2016-12-10 DIAGNOSIS — Z23 Encounter for immunization: Secondary | ICD-10-CM | POA: Diagnosis not present

## 2016-12-10 DIAGNOSIS — M7662 Achilles tendinitis, left leg: Secondary | ICD-10-CM

## 2016-12-10 DIAGNOSIS — M7661 Achilles tendinitis, right leg: Secondary | ICD-10-CM | POA: Diagnosis not present

## 2016-12-10 DIAGNOSIS — M6788 Other specified disorders of synovium and tendon, other site: Secondary | ICD-10-CM

## 2016-12-10 MED ORDER — NITROGLYCERIN 0.2 MG/HR TD PT24: MEDICATED_PATCH | TRANSDERMAL | 1 refills | Status: DC

## 2016-12-10 NOTE — Progress Notes (Signed)
OFFICE VISIT NOTE Timothy [name]. [name] at [facility] [phone]  Timothy [name] - 61 y.o. male MRN [id number]  Date of birth: 07-May-1955  Visit Date: 12/10/2016  PCP: Patient, No Pcp Per   Referred by: No ref. provider found  Fabio Pierce PT, LAT, ATC acting as scribe for Dr. Berline Chough.  SUBJECTIVE:   Chief Complaint  Patient presents with  . New Patient (Initial Visit)    B Achille's pain   HPI: As below and per problem based documentation when appropriate.  Timothy [name] is a new pt presenting w/ c/o B Achille's pain, L>R.  Pt had a similar issue about one year ago and reports having been prescribed eccentric calf exercises and nitro patches which did give some relief.  Pt states that he is retired and goes out of town in the summers when he does a lot of hiking at [employer] parks but feels like he has less issue when he's away from home in [place] and is wondering the decreased temps and humidity makes a difference.  He states that it "balls up" at night and then loosens up throughout the day.  He reports having pain and swelling along the Achille's tendon (more mid-tendon) and rates that the pain is an 7-8/10 at it's worst and 0/10 at it's best.  He is an avid hiker.  He states that he is no longer using the nitro patches and stopped using them about 1.5 years ago after using them consistently for one year after experiencing headaches.  Pt states that his symptoms definitely worsen after hiking and that he uses ice, IBU and rest to help decrease his symptoms but to little avail.  He states that he has been to see several different doctors for the same condition over the past few years including Dr. Darrick Penna.    Review of Systems  Constitutional: Negative for chills, fever and weight loss.  HENT: Negative.   Eyes: Negative.   Respiratory: Negative for cough, shortness of breath and wheezing.   Cardiovascular: Negative for chest pain and  palpitations.  Gastrointestinal: Negative for abdominal pain, heartburn and nausea.  Musculoskeletal: Positive for joint pain. Negative for falls.  Neurological: Negative for dizziness, tingling and headaches.  Endo/Heme/Allergies: Does not bruise/bleed easily.  Psychiatric/Behavioral: Negative for depression. The patient is not nervous/anxious and does not have insomnia.     Otherwise per HPI.  HISTORY & PERTINENT PRIOR DATA:  No specialty comments available. He reports that  has never smoked. he has never used smokeless tobacco. No results for input(s): HGBA1C, LABURIC, CREATINE in the last 8760 hours.  Invalid input(s): CR Allergies reviewed per EMR Prior to Admission medications   Medication Sig Start Date End Date Taking? Authorizing Provider  metFORMIN (GLUCOPHAGE) 500 MG tablet Take 500 mg 1 day or 1 dose by mouth.   Yes [provider]  nitroGLYCERIN (NITRODUR - DOSED IN MG/24 HR) 0.2 mg/hr patch Place 1/4 to 1/2 of a patch over affected region. Remove and replace once daily.  Slightly alter skin placement daily 12/10/16   Andrena Mews, DO   Patient Active Problem List   Diagnosis Date Noted  . Bilateral Achilles tendonosis 12/10/2016   Past Medical History:  Diagnosis Date  . Kidney stones    History reviewed. No pertinent family history. Past Surgical History:  Procedure Laterality Date  . CHOLECYSTECTOMY    . HERNIA REPAIR    . KNEE SURGERY  Social History   Occupational History  . Not on file  Tobacco Use  . Smoking status: Never Smoker  . Smokeless tobacco: Never Used  Substance and Sexual Activity  . Alcohol use: Yes    Comment: occ  . Drug use: No  . Sexual activity: Not on file    OBJECTIVE:  VS:  HT:5\' 10"  (177.8 cm)   WT:220 lb 6.4 oz (100 kg)  BMI:31.62    BP:140/88  HR:71bpm  TEMP: ( )  RESP:95 % EXAM: Findings:  WDWN, NAD, Non-toxic appearing Alert & appropriately interactive Not depressed or anxious appearing No  increased work of breathing. Pupils are equal. EOM intact without nystagmus No clubbing or cyanosis of the extremities appreciated No significant rashes/lesions/ulcerations overlying the examined area. DP & PT pulses 2+/4.  No significant pretibial edema. Sensation intact to light touch in lower extremities.  Bilateral ankles: Overall well aligned.  He has a small amount of swelling over the bilateral avascular zone of the Achilles that is mildly tender on the left and nontender on the right.  He has slight equinus contracture to 95 degrees on the left and 100 degrees on the right.  Ankle exam is otherwise stable.  He does have a moderately high cavus foot that is rigid.    RADIOLOGY: [place] LIMITED JOINT SPACE STRUCTURES LOW BILAT(NO LINKED CHARGES) Andrena Mews, DO     12/10/2016 10:35 AM LIMITED MSK ULTRASOUND OF Bilateral Achilles Images were obtained and interpreted by myself, Gaspar Bidding, DO   Images have been saved and stored to PACS system. Images obtained on: GE S7 Ultrasound machine  FINDINGS:   Fusiform swelling of the bilateral Achilles with normal  Left is measuring 0.91 cm  Right is measuring 0.89 cm.  There is marked hypoechoic change without neovascularity  bilaterally.  IMPRESSION:  1. Bilateral Achilles tendinosis, left worse than right  ASSESSMENT & PLAN:     ICD-10-CM   1. Bilateral Achilles tendonosis M76.61 [place] LIMITED JOINT SPACE STRUCTURES LOW BILAT(NO LINKED CHARGES)   M76.62    ================================================================= Bilateral Achilles tendonosis Marked persistent thickening consistent with Achilles tendinosis that is chronic.  He has previously responded well to nitroglycerin and therapeutic exercises.  We will have him begin with this as well as with custom cushioned insoles, Body Helix compression sleeve and briefly discussed the option for referral for Tenex in the future.  LIMITED MSK ULTRASOUND OF Bilateral  Achilles Images were obtained and interpreted by myself, Gaspar Bidding, DO  Images have been saved and stored to PACS system. Images obtained on: GE S7 Ultrasound machine  FINDINGS:   Fusiform swelling of the bilateral Achilles with normal  Left is measuring 0.91 cm  Right is measuring 0.89 cm.  There is marked hypoechoic change without neovascularity bilaterally.  IMPRESSION:  1. Bilateral Achilles tendinosis, left worse than right  ================================================================= Patient Instructions  Please perform the exercise program that we have prepared for you and gone over in detail on a daily basis.  In addition to the handout you were provided you can access your program through: [email or web address]   Your unique program code is: [email or web address]  I recommend you obtained a compression sleeve to help with your joint problems. There are many options on the market however I recommend obtaining a ankle Body Helix compression sleeve.  You can find information (including how to appropriate measure yourself for sizing) can be found at www.Body [email or web address].ch.  Many of these products are health savings account (HSA) eligible.  You can use the compression sleeve at any time throughout the day but is most important to use while being active as well as for 2 hours post-activity.   It is appropriate to ice following activity with the compression sleeve in place.   ================================================================= No future appointments.  Follow-up: Return in about 6 weeks (around 01/21/2017) for and an orthotic appt.   CMA/ATC served as Neurosurgeonscribe during this visit. History, Physical, and Plan performed by medical provider. Documentation and orders reviewed and attested to.      Gaspar BiddingMichael Rigby, DO    [facility]

## 2016-12-10 NOTE — Addendum Note (Signed)
Addended by: Dierdre SearlesOLEMAN, BRANDY S on: 12/10/2016 10:53 AM   Modules accepted: Orders

## 2016-12-10 NOTE — Patient Instructions (Addendum)
Please perform the exercise program that we have prepared for you and gone over in detail on a daily basis.  In addition to the handout you were provided you can access your program through: [email or web address]   Your unique program code is: [id number]  I recommend you obtained a compression sleeve to help with your joint problems. There are many options on the market however I recommend obtaining a ankle Body Helix compression sleeve.  You can find information (including how to appropriate measure yourself for sizing) can be found at [email or web address] [email or web address].[email or web address].  Many of these products are health savings account (HSA) eligible.   You can use the compression sleeve at any time throughout the day but is most important to use while being active as well as for 2 hours post-activity.   It is appropriate to ice following activity with the compression sleeve in place.

## 2016-12-10 NOTE — Assessment & Plan Note (Signed)
Marked persistent thickening consistent with Achilles tendinosis that is chronic.  He has previously responded well to nitroglycerin and therapeutic exercises.  We will have him begin with this as well as with custom cushioned insoles, Body Helix compression sleeve and briefly discussed the option for referral for Tenex in the future.

## 2016-12-10 NOTE — Procedures (Signed)
LIMITED MSK ULTRASOUND OF Bilateral Achilles Images were obtained and interpreted by myself, Gaspar BiddingMichael Lashaya Kienitz, DO  Images have been saved and stored to PACS system. Images obtained on: GE S7 Ultrasound machine  FINDINGS:   Fusiform swelling of the bilateral Achilles with normal  Left is measuring 0.91 cm  Right is measuring 0.89 cm.  There is marked hypoechoic change without neovascularity bilaterally.  IMPRESSION:  1. Bilateral Achilles tendinosis, left worse than right

## 2016-12-14 ENCOUNTER — Ambulatory Visit: Payer: BLUE CROSS/BLUE SHIELD | Admitting: Sports Medicine

## 2016-12-20 ENCOUNTER — Ambulatory Visit: Payer: BLUE CROSS/BLUE SHIELD | Admitting: Sports Medicine

## 2016-12-20 ENCOUNTER — Encounter: Payer: Self-pay | Admitting: Sports Medicine

## 2016-12-20 VITALS — BP 134/86 | HR 68 | Ht 70.0 in | Wt 225.4 lb

## 2016-12-20 DIAGNOSIS — M7661 Achilles tendinitis, right leg: Secondary | ICD-10-CM | POA: Diagnosis not present

## 2016-12-20 DIAGNOSIS — M6788 Other specified disorders of synovium and tendon, other site: Secondary | ICD-10-CM

## 2016-12-20 DIAGNOSIS — M7662 Achilles tendinitis, left leg: Secondary | ICD-10-CM

## 2016-12-20 DIAGNOSIS — R269 Unspecified abnormalities of gait and mobility: Secondary | ICD-10-CM

## 2016-12-20 NOTE — Procedures (Signed)
PROCEDURE: CUSTOM ORTHOTIC FABRICATION Patient's underlying musculoskeletal conditions are directly related to poor biomechanics and will benefit from a functional custom orthotic.  There are no significant foot deformities that complicate the use of a custom orthotic.  The patient was fitted for a standard, cushioned, semi-rigid orthotic. The orthotic was heated & placed on the orthotic stand. The patient was positioned in subtalar neutral position and 10 of ankle dorsiflexion and weight bearing stance on the heated orthotic blank. After completion of the molding a base was applied to the orthotic blank. The orthotic was ground to a stable position for weightbearing. The patient ambulated in these and reported they were comfortable without pressure spots.              BLANK:  Size 10 - Standard Cushioned                 BASE:  Blue EVA      POSTINGS:  n/a

## 2016-12-20 NOTE — Progress Notes (Signed)
OFFICE VISIT NOTE Timothy FellsMichael D. Timothy Shinerigby, DO  [facility] [phone]  Riccardo DubinRusty W [name] - 61 y.o. male MRN [id number]  Date of birth: 12/04/1955  Visit Date: 12/20/2016  PCP: [name], Timothy A, MD   Referred by: No ref. provider found  Timothy ClinchBrandy [name], CMA acting as scribe for Dr. Berline Choughigby.  SUBJECTIVE:   Chief Complaint  Patient presents with  . Follow-up    achilles pain   HPI: As below and per problem based documentation when appropriate.  Mr. Timothy MilchWendt is an established patient presenting today in follow-up of bilateral achilles pain. He was last seen 12/20/16 and was given body helix compression sleeve and home therapeutic exercises. He is here today to be fitted for custom orthotics.     Review of Systems  Constitutional: Negative for chills, fever and malaise/fatigue.  Respiratory: Negative for shortness of breath and wheezing.   Cardiovascular: Negative for chest pain and palpitations.  Musculoskeletal: Negative.   Neurological: Negative for dizziness, tingling, weakness and headaches.    Otherwise per HPI.  HISTORY & PERTINENT PRIOR DATA:  No specialty comments available. He reports that  has never smoked. he has never used smokeless tobacco. No results for input(s): HGBA1C, LABURIC, CREATINE in the last 8760 hours.  Invalid input(s): CR Allergies reviewed per EMR Prior to Admission medications   Medication Sig Start Date End Date Taking? Authorizing Provider  metFORMIN (GLUCOPHAGE) 500 MG tablet Take 500 mg 1 day or 1 dose by mouth.   Yes [provider]  nitroGLYCERIN (NITRODUR - DOSED IN MG/24 HR) 0.2 mg/hr patch Place 1/4 to 1/2 of a patch over affected region. Remove and replace once daily.  Slightly alter skin placement daily 12/10/16  Yes Andrena Mewsigby, Timothy D, DO   Patient Active Problem List   Diagnosis Date Noted  . Bilateral Achilles tendonosis 12/10/2016   Past Medical History:  Diagnosis Date  . Kidney stones     No family history on file. Past Surgical History:  Procedure Laterality Date  . CHOLECYSTECTOMY    . HERNIA REPAIR    . KNEE SURGERY     Social History   Occupational History  . Not on file  Tobacco Use  . Smoking status: Never Smoker  . Smokeless tobacco: Never Used  Substance and Sexual Activity  . Alcohol use: Yes    Comment: occ  . Drug use: No  . Sexual activity: Not on file    OBJECTIVE:  VS:  HT:5\' 10"  (177.8 cm)   WT:225 lb 6.4 oz (102.2 kg)  BMI:32.34    BP:134/86  HR:68bpm  TEMP: ( )  RESP:96 % EXAM: Findings:  Slightly wide-based gait.  Slight Achilles contracture with high cavus foot.    RADIOLOGY: [place] LIMITED JOINT SPACE STRUCTURES LOW BILAT(NO LINKED CHARGES) Andrena MewsRigby, Timothy D, DO     12/10/2016 10:35 AM LIMITED MSK ULTRASOUND OF Bilateral Achilles Images were obtained and interpreted by myself, Gaspar BiddingMichael Rigby, DO   Images have been saved and stored to PACS system. Images obtained on: GE S7 Ultrasound machine  FINDINGS:   Fusiform swelling of the bilateral Achilles with normal  Left is measuring 0.91 cm  Right is measuring 0.89 cm.  There is marked hypoechoic change without neovascularity  bilaterally.  IMPRESSION:  1. Bilateral Achilles tendinosis, left worse than right  ASSESSMENT & PLAN:     ICD-10-CM   1. Bilateral Achilles tendonosis M76.61 Misc procedure   M76.62   2. Abnormal gait R26.9  Misc procedure   =================================================================  PROCEDURE: CUSTOM ORTHOTIC FABRICATION Patient's underlying musculoskeletal conditions are directly related to poor biomechanics and will benefit from a functional custom orthotic.  There are no significant foot deformities that complicate the use of a custom orthotic.  The patient was fitted for a standard, cushioned, semi-rigid orthotic. The orthotic was heated & placed on the orthotic stand. The patient was positioned in subtalar neutral position and 10 of  ankle dorsiflexion and weight bearing stance on the heated orthotic blank. After completion of the molding a base was applied to the orthotic blank. The orthotic was ground to a stable position for weightbearing. The patient ambulated in these and reported they were comfortable without pressure spots.              BLANK:  Size 10 - Standard Cushioned                 BASE:  Blue EVA      POSTINGS:  n/a  ================================================================= There are no Patient Instructions on file for this visit. ================================================================= Future Appointments  Date Time Provider Department Center  01/21/2017  9:20 AM Andrena Mewsigby, Timothy D, DO [facility] None    Follow-up: Return in about 4 weeks (around 01/20/2017) for 6 wk follow-up from initial visit.   CMA/ATC served as Neurosurgeonscribe during this visit. History, Physical, and Plan performed by medical provider. Documentation and orders reviewed and attested to.      Gaspar BiddingMichael Rigby, DO    [facility]

## 2017-01-21 ENCOUNTER — Ambulatory Visit: Payer: BLUE CROSS/BLUE SHIELD | Admitting: Sports Medicine

## 2017-01-21 ENCOUNTER — Encounter: Payer: Self-pay | Admitting: Sports Medicine

## 2017-01-21 ENCOUNTER — Ambulatory Visit: Payer: Self-pay

## 2017-01-21 VITALS — BP 130/86 | HR 68 | Ht 70.0 in | Wt 223.0 lb

## 2017-01-21 DIAGNOSIS — M7662 Achilles tendinitis, left leg: Secondary | ICD-10-CM | POA: Diagnosis not present

## 2017-01-21 DIAGNOSIS — M7661 Achilles tendinitis, right leg: Secondary | ICD-10-CM

## 2017-01-21 DIAGNOSIS — M6788 Other specified disorders of synovium and tendon, other site: Secondary | ICD-10-CM

## 2017-01-21 NOTE — Assessment & Plan Note (Signed)
He overall is doing significantly better than in the past.  He still has changes on his ultrasound that showed chronic Achilles tendinosis left worse than right however there is improved neovascularity hand anticipate him continuing to do well.  He will start with nitroglycerin patches bilaterally continue with his [place] exercises, compression and custom cushioned insoles that have been significantly beneficial.  Follow-up in 10 weeks for repeat ultrasound at that time and anticipate hopefully be able to discontinue nitroglycerin at that time as well.

## 2017-01-21 NOTE — Patient Instructions (Signed)
Keep doing her exercises Try nitroglycerin on both legs.  We will check in with you in 10 weeks for repeat ultrasound at that time.

## 2017-01-21 NOTE — [facility] Notes (Signed)
Veverly FellsMichael D. Delorise Shinerigby, DO  [facility] [place] [phone]  Timothy [name] - 61 y.o. male MRN [id number]  Date of birth: 05/31/1955  Visit Date: 01/21/2017  PCP: [name], [name], MD   Referred by: No ref. provider found   Scribe for today's visit: [name], ATC    SUBJECTIVE:  Timothy [name] is here for Follow-up (B Achille's tendinosis) .   [name] presented as a new pt on 12/10/16 w/ c/o B Achille's pain, L>R.  Pt had a similar issue about one year ago and reports having been prescribed eccentric calf exercises and nitro patches which did give some relief.  Pt states that he is retired and goes out of town in the summers when he does a lot of [place] [employer] but feels like he has less issue when he's away from home in [place] and is wondering the decreased temps and humidity makes a difference.  He states that it "balls up" at night and then loosens up throughout the day.  He reports having pain and swelling along the Achille's tendon (more mid-tendon) and rates that the pain is an 7-8/10 at it's worst and 0/10 at it's best.  He is an avid hiker.  He states that he is no longer using the nitro patches and stopped using them about 1.5 years ago after using them consistently for one year after experiencing headaches.  Pt states that his symptoms definitely worsen after hiking and that he uses ice, IBU and rest to help decrease his symptoms but to little avail.  He states that he has been to see several different doctors for the same condition over the past few years including Dr. Darrick PennaFields.    Compared to the last office visit on 12/20/16, his previously described B Achille's symptoms are improving with less pain noted (2/10 today). Current symptoms are mild & are nonradiating.  Symptoms are worse in the mornings. He has been using his custom-made orthotics, the nitroglycerin patches and is using his Body Helix ankle sleeve.     ROS Denies night  time disturbances. Denies fevers, chills, or night sweats. Denies unexplained weight loss. Denies personal history of cancer. Denies changes in bowel or bladder habits. Denies recent unreported falls. Denies new or worsening dyspnea or wheezing. Denies headaches or dizziness.  Denies numbness, tingling or weakness  In the extremities.  Denies dizziness or presyncopal episodes Reports lower extremity edema at the L Achille's.    HISTORY & PERTINENT PRIOR DATA:  Prior History reviewed and updated per electronic medical record.  Significant history, findings, studies and interim changes include:  reports that  has never smoked. he has never used smokeless tobacco. No results for input(s): HGBA1C, LABURIC, CREATINE in the last 8760 hours. No specialty comments available. Problem  Bilateral Achilles Tendonosis     OBJECTIVE:  VS:  HT:5\' 10"  (177.8 cm)   WT:223 lb (101.2 kg)  BMI:32    BP:130/86  HR:68bpm  TEMP: ( )  RESP:96 %  PHYSICAL EXAM: Constitutional: WDWN, Non-toxic appearing. Psychiatric: Alert & appropriately interactive. Not depressed or anxious appearing. Respiratory: No increased work of breathing. Trachea Midline Eyes: Pupils are equal. EOM intact without nystagmus. No scleral icterus Cardiovascular:  Peripheral Pulses: peripheral pulses symmetrical No clubbing or cyanosis appreciated Capillary Refill is normal, less than 2 seconds No signficant generalized edema/anasarca Sensory Exam: intact to light touch  Bilateral Achilles: Left greater than right nodular swelling of the avascular zone.  He has mild TDP over the left nodule but essentially none on the right.  Dorsiflexion and plantarflexion strength is 5 out of 5 with dorsiflexion to 110 degrees on the left compared to 115 degrees on the right.  Exam is otherwise stable.  Calves are supple without significant lower extremity swelling.  ASSESSMENT & PLAN:   1. Bilateral Achilles tendonosis    PLAN:      Bilateral Achilles tendonosis He overall is doing significantly better than in the past.  He still has changes on his ultrasound that showed chronic Achilles tendinosis left worse than right however there is improved neovascularity hand anticipate him continuing to do well.  He will start with nitroglycerin patches bilaterally continue with his Alfredson exercises, compression and custom cushioned insoles that have been significantly beneficial.  Follow-up in 10 weeks for repeat ultrasound at that time and anticipate hopefully be able to discontinue nitroglycerin at that time as well.   ++++++++++++++++++++++++++++++++++++++++++++ Orders & Meds: Orders Placed This Encounter  Procedures  . US LIMITED JOINT SPACE STRUCTURES LOW BILAT(NO LINKED CHARGES)    No orders of the defined types were placed in this encounter.   ++++++++++++++++++++++++++++++++++++++++++++ Follow-up: Return in about 10 weeks (around 04/01/2017).   Pertinent documentation may be included in additional procedure notes, imaging studies, problem based documentation and patient instructions. Please see these sections of the encounter for additional information regarding this visit. CMA/ATC served as Neurosurgeonscribe during this visit. History, Physical, and Plan performed by medical provider. Documentation and orders reviewed and attested to.      Andrena MewsMichael D Uilani Sanville, DO    [facility] [place] [facility] Physician

## 2017-01-21 NOTE — Procedures (Signed)
LIMITED MSK ULTRASOUND OF bilateral Achilles Images were obtained and interpreted by myself, Gaspar BiddingMichael Donie Lemelin, DO  Images have been saved and stored to PACS system. Images obtained on: GE S7 Ultrasound machine  FINDINGS:   Left Achilles: Significant mucinous change within the avascular zone of the left Achilles.  Overall diameter measures 0.89 cm which is slightly improved.  He has improved neovascularity within the peritenon using nitroglycerin.  Fibrillar structure is maintained however slightly diminished compared to normal.  Right Achilles: Persistent nodularity with overall maintained fibrillar structure, improved  IMPRESSION:  1. Bilateral chronic Achilles tendinosis

## 2017-02-04 DIAGNOSIS — M5432 Sciatica, left side: Secondary | ICD-10-CM | POA: Diagnosis not present

## 2017-02-04 DIAGNOSIS — M9903 Segmental and somatic dysfunction of lumbar region: Secondary | ICD-10-CM | POA: Diagnosis not present

## 2017-02-05 ENCOUNTER — Other Ambulatory Visit: Payer: Self-pay | Admitting: Sports Medicine

## 2017-02-07 DIAGNOSIS — M9903 Segmental and somatic dysfunction of lumbar region: Secondary | ICD-10-CM | POA: Diagnosis not present

## 2017-02-07 DIAGNOSIS — M5432 Sciatica, left side: Secondary | ICD-10-CM | POA: Diagnosis not present

## 2017-02-11 DIAGNOSIS — M9903 Segmental and somatic dysfunction of lumbar region: Secondary | ICD-10-CM | POA: Diagnosis not present

## 2017-02-11 DIAGNOSIS — M5432 Sciatica, left side: Secondary | ICD-10-CM | POA: Diagnosis not present

## 2017-02-14 DIAGNOSIS — M5432 Sciatica, left side: Secondary | ICD-10-CM | POA: Diagnosis not present

## 2017-02-14 DIAGNOSIS — M9903 Segmental and somatic dysfunction of lumbar region: Secondary | ICD-10-CM | POA: Diagnosis not present

## 2017-02-18 DIAGNOSIS — M9903 Segmental and somatic dysfunction of lumbar region: Secondary | ICD-10-CM | POA: Diagnosis not present

## 2017-02-18 DIAGNOSIS — M5432 Sciatica, left side: Secondary | ICD-10-CM | POA: Diagnosis not present

## 2017-02-20 DIAGNOSIS — M9903 Segmental and somatic dysfunction of lumbar region: Secondary | ICD-10-CM | POA: Diagnosis not present

## 2017-02-20 DIAGNOSIS — M5432 Sciatica, left side: Secondary | ICD-10-CM | POA: Diagnosis not present

## 2017-02-25 DIAGNOSIS — M9903 Segmental and somatic dysfunction of lumbar region: Secondary | ICD-10-CM | POA: Diagnosis not present

## 2017-02-25 DIAGNOSIS — M5432 Sciatica, left side: Secondary | ICD-10-CM | POA: Diagnosis not present

## 2017-02-28 DIAGNOSIS — M9903 Segmental and somatic dysfunction of lumbar region: Secondary | ICD-10-CM | POA: Diagnosis not present

## 2017-02-28 DIAGNOSIS — M5432 Sciatica, left side: Secondary | ICD-10-CM | POA: Diagnosis not present

## 2017-03-04 DIAGNOSIS — M5432 Sciatica, left side: Secondary | ICD-10-CM | POA: Diagnosis not present

## 2017-03-04 DIAGNOSIS — M9903 Segmental and somatic dysfunction of lumbar region: Secondary | ICD-10-CM | POA: Diagnosis not present

## 2017-03-07 DIAGNOSIS — M9903 Segmental and somatic dysfunction of lumbar region: Secondary | ICD-10-CM | POA: Diagnosis not present

## 2017-03-07 DIAGNOSIS — M5432 Sciatica, left side: Secondary | ICD-10-CM | POA: Diagnosis not present

## 2017-03-11 DIAGNOSIS — M9903 Segmental and somatic dysfunction of lumbar region: Secondary | ICD-10-CM | POA: Diagnosis not present

## 2017-03-11 DIAGNOSIS — M5432 Sciatica, left side: Secondary | ICD-10-CM | POA: Diagnosis not present

## 2017-03-18 DIAGNOSIS — M9903 Segmental and somatic dysfunction of lumbar region: Secondary | ICD-10-CM | POA: Diagnosis not present

## 2017-03-18 DIAGNOSIS — M5432 Sciatica, left side: Secondary | ICD-10-CM | POA: Diagnosis not present

## 2017-03-25 DIAGNOSIS — M9903 Segmental and somatic dysfunction of lumbar region: Secondary | ICD-10-CM | POA: Diagnosis not present

## 2017-03-25 DIAGNOSIS — M5432 Sciatica, left side: Secondary | ICD-10-CM | POA: Diagnosis not present

## 2017-04-01 ENCOUNTER — Ambulatory Visit: Payer: Self-pay

## 2017-04-01 ENCOUNTER — Ambulatory Visit: Payer: BLUE CROSS/BLUE SHIELD | Admitting: Sports Medicine

## 2017-04-01 ENCOUNTER — Encounter: Payer: Self-pay | Admitting: Sports Medicine

## 2017-04-01 VITALS — BP 120/88 | HR 70 | Ht 70.0 in | Wt 223.8 lb

## 2017-04-01 DIAGNOSIS — M6788 Other specified disorders of synovium and tendon, other site: Secondary | ICD-10-CM

## 2017-04-01 DIAGNOSIS — N200 Calculus of kidney: Secondary | ICD-10-CM | POA: Insufficient documentation

## 2017-04-01 DIAGNOSIS — M7661 Achilles tendinitis, right leg: Secondary | ICD-10-CM | POA: Diagnosis not present

## 2017-04-01 DIAGNOSIS — M7662 Achilles tendinitis, left leg: Secondary | ICD-10-CM | POA: Diagnosis not present

## 2017-04-01 NOTE — [facility] Notes (Signed)
Veverly FellsMichael D. Delorise Shinerigby, DO  [facility] [place] [phone]  Timothy [name] - 62 y.o. male MRN [id number]  Date of birth: 09/29/1955  Visit Date: 04/01/2017  PCP: [name], [name], MD   Referred by: [name], [name], MD   Scribe for today's visit: Stevenson ClinchBrandy Coleman, CMA     SUBJECTIVE:  Timothy [name] is here for Follow-up (bilateral achilles tendonosis)  Timothy [name] presented as a new pt on 12/10/16 w/ c/o B Achille's pain, L>R.  Pt had a similar issue about one year ago and reports having been prescribed eccentric calf exercises and nitro patches which did give some relief.  Pt states that he is retired and goes out of town in the summers when he does a lot of hiking [place] [employer] but feels like he has less issue when he's away from home in [place] and is wondering the decreased temps and humidity makes a difference.  He states that it "balls up" at night and then loosens up throughout the day.  He reports having pain and swelling along the Achille's tendon (more mid-tendon) and rates that the pain is an 7-8/10 at it's worst and 0/10 at it's best.  He is an avid hiker.  He states that he is no longer using the nitro patches and stopped using them about 1.5 years ago after using them consistently for one year after experiencing headaches.  Pt states that his symptoms definitely worsen after hiking and that he uses ice, IBU and rest to help decrease his symptoms but to little avail.  He states that he has been to see several different doctors for the same condition over the past few years including Dr. Darrick PennaFields.    01/21/18: Compared to the last office visit on 12/20/16, his previously described B Achille's symptoms are improving with less pain noted (2/10 today). Current symptoms are mild & are nonradiating.  Symptoms are worse in the mornings. He has been using his custom-made orthotics, the nitroglycerin patches and is using his Body Helix ankle sleeve.      04/01/17: Compared to the last office visit, his previously described symptoms show no change  Current symptoms are mild & are nonradiating. He has not noticed much swelling since his last visit. He has been icing prn.  He has been using Nitroglycerin patches. He has noticed occasional HA and raw skin where the patch is being placed. He has been wearing Body Helix compression sleeve with some benefit. He wears the sleeve while being active. He is happy with orthotics that were made.    ROS Denies night time disturbances. Denies fevers, chills, or night sweats. Denies unexplained weight loss. Denies personal history of cancer. Denies changes in bowel or bladder habits. Denies recent unreported falls. Denies new or worsening dyspnea or wheezing. Denies headaches or dizziness.  Denies numbness, tingling or weakness  In the extremities.  Denies dizziness or presyncopal episodes Reports lower extremity edema     HISTORY & PERTINENT PRIOR DATA:  Prior History reviewed and updated per electronic medical record.  Significant history, findings, studies and interim changes include:  reports that  has never smoked. he has never used smokeless tobacco. No results for input(s): HGBA1C, LABURIC, CREATINE in the last 8760 hours. No specialty comments available. Problem  Kidney Stones   Overview:  2 -3 times/yr. [facility]     OBJECTIVE:  VS:  HT:5\' 10"  (177.8 cm)   WT:223 lb 12.8 oz (101.5 kg)  BMI:32.11    BP:120/88  HR:70bpm  TEMP: ( )  RESP:95 %   PHYSICAL EXAM: Constitutional: WDWN, Non-toxic appearing. Psychiatric: Alert & appropriately interactive.  Not depressed or anxious appearing. Respiratory: No increased work of breathing.  Trachea Midline Eyes: Pupils are equal.  EOM intact without nystagmus.  No scleral icterus  NEUROVASCULAR exam: No clubbing or cyanosis appreciated No significant venous stasis changes Capillary Refill: normal, less than 2 seconds    Bilateral Achilles with a small amount of swelling left worse than right.  He has overall good dorsiflexion and plantarflexion.  No significant pain with Achilles squeeze test.   ASSESSMENT & PLAN:   1. Bilateral Achilles tendonosis     ++++++++++++++++++++++++++++++++++++++++++++ PLAN:   Findings:  He is overall feeling better although his Achilles to continue to remain swollen.  We will have him discontinue nitroglycerin at this point due to overall steady improvement and likely low efficacy of continued use.  He discuss additional therapeutic interventions including Piezo Wave therapy and information to [facility] chiropractic was provided.  We will have him continue with therapeutic exercises compression and activity modifications as tolerated.  He is going to [place] in early summer and will be there for the remainder of the summer.  His symptoms typically do not bother him when he is at altitude which is reassuring.  Okay to refill nitroglycerin if he requests and follow-up in 6 months if persistent ongoing symptoms otherwise as needed.   No problem-specific Assessment & Plan notes found for this encounter.   Follow-up: Return if symptoms worsen or fail to improve.   Pertinent documentation may be included in additional procedure notes, imaging studies, problem based documentation and patient instructions. Please see these sections of the encounter for additional information regarding this visit. CMA/ATC served as Neurosurgeon during this visit. History, Physical, and Plan performed by medical provider. Documentation and orders reviewed and attested to.      Andrena Mews, DO    [place] [facility] Physician

## 2017-04-01 NOTE — Procedures (Signed)
LIMITED MSK ULTRASOUND OF bilateral Achilles Images were obtained and interpreted by myself, Gaspar BiddingMichael Leen Tworek, DO  Images have been saved and stored to PACS system. Images obtained on: GE S7 Ultrasound machine  FINDINGS:   Improved fibrillar structure of the left Achilles with persistent thickening and fusiform swelling measuring at maximum 1.01 cm.  There is minimal neovascularity.  No significant pain with sono palpation  Right Achilles has fusiform swelling as well but minimal hypoechoic change and no increased neovascularity.  This is completely pain-free on the right.  IMPRESSION:  1. Bilateral left worse than right Achilles tendinosis with chronic swelling.

## 2017-04-15 DIAGNOSIS — M9903 Segmental and somatic dysfunction of lumbar region: Secondary | ICD-10-CM | POA: Diagnosis not present

## 2017-04-15 DIAGNOSIS — M5432 Sciatica, left side: Secondary | ICD-10-CM | POA: Diagnosis not present

## 2017-04-23 DIAGNOSIS — Z13220 Encounter for screening for lipoid disorders: Secondary | ICD-10-CM | POA: Diagnosis not present

## 2017-04-23 DIAGNOSIS — Z1211 Encounter for screening for malignant neoplasm of colon: Secondary | ICD-10-CM | POA: Diagnosis not present

## 2017-04-23 DIAGNOSIS — Z125 Encounter for screening for malignant neoplasm of prostate: Secondary | ICD-10-CM | POA: Diagnosis not present

## 2017-04-23 DIAGNOSIS — E119 Type 2 diabetes mellitus without complications: Secondary | ICD-10-CM | POA: Diagnosis not present

## 2017-04-23 DIAGNOSIS — Z0000 Encounter for general adult medical examination without abnormal findings: Secondary | ICD-10-CM | POA: Diagnosis not present

## 2017-05-01 DIAGNOSIS — Z1211 Encounter for screening for malignant neoplasm of colon: Secondary | ICD-10-CM | POA: Diagnosis not present

## 2017-05-01 DIAGNOSIS — Z1212 Encounter for screening for malignant neoplasm of rectum: Secondary | ICD-10-CM | POA: Diagnosis not present

## 2017-05-27 DIAGNOSIS — L247 Irritant contact dermatitis due to plants, except food: Secondary | ICD-10-CM | POA: Diagnosis not present

## 2017-06-12 DIAGNOSIS — Z7984 Long term (current) use of oral hypoglycemic drugs: Secondary | ICD-10-CM | POA: Diagnosis not present

## 2017-06-12 DIAGNOSIS — H2513 Age-related nuclear cataract, bilateral: Secondary | ICD-10-CM | POA: Diagnosis not present

## 2017-06-12 DIAGNOSIS — D3131 Benign neoplasm of right choroid: Secondary | ICD-10-CM | POA: Diagnosis not present

## 2017-06-12 DIAGNOSIS — E119 Type 2 diabetes mellitus without complications: Secondary | ICD-10-CM | POA: Diagnosis not present

## 2017-11-22 DIAGNOSIS — Z23 Encounter for immunization: Secondary | ICD-10-CM | POA: Diagnosis not present

## 2017-12-10 ENCOUNTER — Encounter: Payer: Self-pay | Admitting: Sports Medicine

## 2017-12-10 ENCOUNTER — Ambulatory Visit (INDEPENDENT_AMBULATORY_CARE_PROVIDER_SITE_OTHER): Payer: BLUE CROSS/BLUE SHIELD

## 2017-12-10 ENCOUNTER — Ambulatory Visit: Payer: BLUE CROSS/BLUE SHIELD | Admitting: Sports Medicine

## 2017-12-10 ENCOUNTER — Ambulatory Visit: Payer: Self-pay

## 2017-12-10 VITALS — BP 140/84 | HR 80 | Ht 70.0 in | Wt 228.6 lb

## 2017-12-10 DIAGNOSIS — M47812 Spondylosis without myelopathy or radiculopathy, cervical region: Secondary | ICD-10-CM | POA: Insufficient documentation

## 2017-12-10 DIAGNOSIS — M7502 Adhesive capsulitis of left shoulder: Secondary | ICD-10-CM | POA: Diagnosis not present

## 2017-12-10 DIAGNOSIS — G8929 Other chronic pain: Secondary | ICD-10-CM | POA: Diagnosis not present

## 2017-12-10 DIAGNOSIS — M25512 Pain in left shoulder: Secondary | ICD-10-CM

## 2017-12-10 DIAGNOSIS — M4802 Spinal stenosis, cervical region: Secondary | ICD-10-CM | POA: Diagnosis not present

## 2017-12-10 MED ORDER — AMITRIPTYLINE HCL 25 MG PO TABS: 25.0000 mg | ORAL_TABLET | Freq: Every day | ORAL | 3 refills | Status: DC

## 2017-12-10 NOTE — Procedures (Signed)
PROCEDURE NOTE:  Ultrasound Guided: Injection: Left shoulder Images were obtained and interpreted by myself, Remona Boom, DO  Images have been saved and stored to PACS system. Images obtained on: GE S7 Ultrasound machine    ULTRASOUND FINDINGS:  Left shoulder has significant restriction in tissue mobility with internal and external rotation consistent with adhesive capsulitis.  No evidence of rotator cuff tear.  DESCRIPTION OF PROCEDURE:  The patient's clinical condition is marked by substantial pain and/or significant functional disability. Other conservative therapy has not provided relief, is contraindicated, or not appropriate. There is a reasonable likelihood that injection will significantly improve the patient's pain and/or functional impairment.   After discussing the risks, benefits and expected outcomes of the injection and all questions were reviewed and answered, the patient wished to undergo the above named procedure.  Verbal consent was obtained.  The ultrasound was used to identify the target structure and adjacent neurovascular structures. The skin was then prepped in sterile fashion and the target structure was injected under direct visualization using sterile technique as below:  Single injection performed as below: PREP: Alcohol and Ethel Chloride APPROACH:posterior, stopcock technique, 21g 2 in. INJECTATE: 5 cc 1% lidocaine, 2 cc 0.5% Marcaine, 2 cc 40mg/mL DepoMedrol and 5 cc normal saline ASPIRATE: None DRESSING: Band-Aid  Post procedural instructions including recommending icing and warning signs for infection were reviewed.    This procedure was well tolerated and there were no complications.   IMPRESSION: Succesful Ultrasound Guided: Injection 

## 2017-12-10 NOTE — [facility] Notes (Signed)
Timothy [name]. [name] [phone]  Timothy [name] - 63 y.o. male MRN [id number]  Date of birth: 01-02-1956  Visit Date: 12/10/2017  PCP: Timothy Presto, MD   Referred by: Timothy Presto, MD   Scribe(s) for today's visit: Timothy [name], CMA  SUBJECTIVE:  Timothy [name]" is here for L shoulder pain  HPI: His L shoulder pain symptoms INITIALLY: Began over the past year and MOI is unknown.  Described as severe stabbing/sharp, radiating to the lower bicep - aching pain.  Worsened with reaching down and back. Improved with rest.  Additional associated symptoms include: Worse when reaching down and back, when reaching over head. He has neck pain, grinding, constant. He reports clicking, popping, catching in the shoulder. ROM has decreased. Sx are worse in the morning. He denies weakness in the L arm and decreased grip strength. Pain is mostly posterior and radiates into the deltoid.     At this time symptoms are worsening compared to onset. He has been taking IBU with some relief. He is not currently taking anything for GERD. He has tried using ice with some relief. A warm shower seems to loosen things up. His son is going through a PT program and has given him some exercises to do which has seemed to help slightly. He saw a chiropractor about 1 year ago for LBP, has not been seen for neck or shoulder pain.   No recent XR of L shoulder or neck.   REVIEW OF SYSTEMS: Reports night time disturbances. Denies fevers, chills, or night sweats. Denies unexplained weight loss. Denies personal history of cancer. Denies changes in bowel or bladder habits. Denies recent unreported falls. Denies new or worsening dyspnea or wheezing. Denies headaches or dizziness.  Denies numbness, tingling or weakness in the extremities.  Denies dizziness or presyncopal episodes. Denies lower extremity edema    HISTORY:  Prior  history reviewed and updated per electronic medical record.  Social History   Occupational History  . Not on file  Tobacco Use  . Smoking status: Never Smoker  . Smokeless tobacco: Never Used  Substance and Sexual Activity  . Alcohol use: Yes    Comment: occ  . Drug use: No  . Sexual activity: Not on file   Social History   Social History Narrative  . Not on file    DATA OBTAINED & REVIEWED:  No results for input(s): HGBA1C, LABURIC, CREATINE in the last 8760 hours. Problem  Adhesive Capsulitis of Left Shoulder  Spondylosis of Cervical Region Without Myelopathy Or Radiculopathy   .   OBJECTIVE:  VS:  HT:5\' 10"  (177.8 cm)   WT:228 lb 9.6 oz (103.7 kg)  BMI:32.8    BP:140/84  HR:80bpm  TEMP: ( )  RESP:96 %   PHYSICAL EXAM: CONSTITUTIONAL: Well-developed, Well-nourished and In no acute distress PSYCHIATRIC: Alert & appropriately interactive. and Not depressed or anxious appearing. RESPIRATORY: No increased work of breathing and Trachea Midline EYES: Pupils are equal., EOM intact without nystagmus. and No scleral icterus.  VASCULAR EXAM: Warm and well perfused NEURO: unremarkable Normal associated myotomal distribution strength to manual muscle testing Normal sensation to light touch Normal and symmetric associated DTRs  MSK Exam: Left shoulder  Well aligned, no significant deformity. No overlying skin changes. No focal bony tenderness   RANGE OF MOTION & STRENGTH  IR limited to left SI joint.  At 30 degrees of abduction is approximately 60  degrees deficit internal rotation compared to the contralateral side.   SPECIALITY TESTING:  Timothy [name]: normal, no pain Neers: positive, mild pain  Empty Can: normal, no pain Strength: Normal Speed's: normal, no pain Strength: Normal O'Briens: normal, no pain Strength: Normal     ASSESSMENT   1. Chronic left shoulder pain   2. Adhesive capsulitis of left shoulder   3. Spondylosis of cervical region without  myelopathy or radiculopathy     PLAN:  Pertinent additional documentation may be included in corresponding procedure notes, imaging studies, problem based documentation and patient instructions.  Procedures:  . US Guided Injection per procedure note  Medications:  Meds ordered this encounter  Medications  . amitriptyline (ELAVIL) 25 MG tablet    Sig: Take 1 tablet (25 mg total) by mouth at bedtime.    Dispense:  30 tablet    Refill:  3   Discussion/Instructions: No problem-specific Assessment & Plan notes found for this encounter.  . Large-volume intra-articular injection performed today per procedure note. . Classic symptoms of adhesive capsulitis.  No focal findings on MSK ultrasound of the rotator cuff tear  . Discussed red flag symptoms that warrant earlier emergent evaluation and patient voices understanding. . Activity modifications and the importance of avoiding exacerbating activities (limiting pain to no more than a 4 / 10 during or following activity) recommended and discussed.  Follow-up:  . Return in about 6 weeks (around 01/21/2018) for consideration of repeat injections.  . If any lack of improvement consider: further diagnostic evaluation with MRI of the shoulder. . At follow up will plan to consider: repeat corticosteroid injections or Further evaluation and diagnostic treatment of his neck.     CMA/ATC served as Neurosurgeon during this visit. History, Physical, and Plan performed by medical provider. Documentation and orders reviewed and attested to.      Timothy Mews, DO    [place] [facility] Physician

## 2017-12-10 NOTE — [facility] Notes (Signed)
PROCEDURE NOTE:  Ultrasound Guided: Injection: Left shoulder Images were obtained and interpreted by myself, Gaspar Bidding, DO  Images have been saved and stored to PACS system. Images obtained on: GE S7 Ultrasound machine    ULTRASOUND FINDINGS:  Left shoulder has significant restriction in tissue mobility with internal and external rotation consistent with adhesive capsulitis.  No evidence of rotator cuff tear.  DESCRIPTION OF PROCEDURE:  The patient's clinical condition is marked by substantial pain and/or significant functional disability. Other conservative therapy has not provided relief, is contraindicated, or not appropriate. There is a reasonable likelihood that injection will significantly improve the patient's pain and/or functional impairment.   After discussing the risks, benefits and expected outcomes of the injection and all questions were reviewed and answered, the patient wished to undergo the above named procedure.  Verbal consent was obtained.  The ultrasound was used to identify the target structure and adjacent neurovascular structures. The skin was then prepped in sterile fashion and the target structure was injected under direct visualization using sterile technique as below:  Single injection performed as below: PREP: Alcohol and Ethel Chloride APPROACH:posterior, stopcock technique, 21g 2 in. INJECTATE: 5 cc 1% lidocaine, 2 cc 0.5% Marcaine, 2 cc 40mg /mL DepoMedrol and 5 cc normal saline ASPIRATE: None DRESSING: Band-Aid  Post procedural instructions including recommending icing and warning signs for infection were reviewed.    This procedure was well tolerated and there were no complications.   IMPRESSION: Succesful Ultrasound Guided: Injection

## 2017-12-10 NOTE — Patient Instructions (Signed)
You had an injection today.  Things to be aware of after injection are listed below: . You may experience no significant improvement or even a slight worsening in your symptoms during the first 24 to 48 hours.  After that we expect your symptoms to improve gradually over the next 2 weeks for the medicine to have its maximal effect.  You should continue to have improvement out to 6 weeks after your injection. . Dr. Aztlan Coll recommends icing the site of the injection for 20 minutes  1-2 times the day of your injection . You may shower but no swimming, tub bath or Jacuzzi for 24 hours. . If your bandage falls off this does not need to be replaced.  It is appropriate to remove the bandage after 4 hours. . You may resume light activities as tolerated unless otherwise directed per Dr. Mahayla Haddaway during your visit  POSSIBLE STEROID SIDE EFFECTS:  Side effects from injectable steroids tend to be less than when taken orally however you may experience some of the symptoms listed below.  If experienced these should only last for a short period of time. Change in menstrual flow  Edema (swelling)  Increased appetite Skin flushing (redness)  Skin rash/acne  Thrush (oral) Yeast vaginitis    Increased sweating  Depression Increased blood glucose levels Cramping and leg/calf  Euphoria (feeling happy)  POSSIBLE PROCEDURE SIDE EFFECTS: The side effects of the injection are usually fairly minimal however if you may experience some of the following side effects that are usually self-limited and will is off on their own.  If you are concerned please feel free to call the office with questions:  Increased numbness or tingling  Nausea or vomiting  Swelling or bruising at the injection site   Please call our office if if you experience any of the following symptoms over the next week as these can be signs of infection:   Fever greater than 100.5F  Significant swelling at the injection site  Significant redness or drainage  from the injection site  If after 2 weeks you are continuing to have worsening symptoms please call our office to discuss what the next appropriate actions should be including the potential for a return office visit or other diagnostic testing.    

## 2018-01-21 ENCOUNTER — Encounter: Payer: Self-pay | Admitting: Sports Medicine

## 2018-01-21 ENCOUNTER — Ambulatory Visit: Payer: BLUE CROSS/BLUE SHIELD | Admitting: Sports Medicine

## 2018-01-21 VITALS — BP 138/88 | HR 78 | Ht 70.0 in | Wt 228.6 lb

## 2018-01-21 DIAGNOSIS — M24559 Contracture, unspecified hip: Secondary | ICD-10-CM

## 2018-01-21 DIAGNOSIS — M25512 Pain in left shoulder: Secondary | ICD-10-CM | POA: Diagnosis not present

## 2018-01-21 DIAGNOSIS — M7502 Adhesive capsulitis of left shoulder: Secondary | ICD-10-CM | POA: Diagnosis not present

## 2018-01-21 DIAGNOSIS — G8929 Other chronic pain: Secondary | ICD-10-CM

## 2018-01-21 NOTE — Progress Notes (Signed)
Timothy FellsMichael D. Delorise Shinerigby, DO  [facility] [place] [phone]  Timothy DubinRusty W [name] - 62 y.o. male MRN [id number]  Date of birth: 05/13/1955  Visit Date: 01/21/2018  PCP: [name], [name], MD   Referred by: [name], [name], MD  SUBJECTIVE:   Chief Complaint  Patient presents with  . f/u L shoulder pain    Sx have greatly improved since receiving steroid inj.     HPI: Patient reports great improvement in his left shoulder pain.  He has had almost complete resolution of this arm stiffness but still lacks some overhead range of motion.  No nighttime disturbances.  Denies any fevers or chills.  He additionally reports he does have tightness in the left hip and chronic low back pain.  Nonradiating.  REVIEW OF SYSTEMS: Per HPI  HISTORY:  Prior history reviewed and updated per electronic medical record.  Patient Active Problem List   Diagnosis Date Noted  . Adhesive capsulitis of left shoulder 12/10/2017  . Spondylosis of cervical region without myelopathy or radiculopathy 12/10/2017  . Kidney stones 04/01/2017    Overview:  2 -3 times/yr. [facility]   . Bilateral Achilles tendonosis 12/10/2016   Social History   Occupational History  . Not on file  Tobacco Use  . Smoking status: Never Smoker  . Smokeless tobacco: Never Used  Substance and Sexual Activity  . Alcohol use: Yes    Comment: occ  . Drug use: No  . Sexual activity: Not on file   Social History   Social History Narrative  . Not on file    OBJECTIVE:  VS:  HT:5\' 10"  (177.8 cm)   WT:228 lb 9.6 oz (103.7 kg)  BMI:32.8    BP:138/88  HR:78bpm  TEMP: ( )  RESP:95 %   PHYSICAL EXAM: Adult male.  No acute distress.  Alert and appropriate.  Left shoulder with markedly improved shoulder arc at 30 degrees of abduction.  He is approximately 20degrees deficit on the left compared to the right.  Intrinsic rotator cuff strength is intact.  He has no pain with axial load  and circumduction.  His left hip does have tightness.  Positive ASIS compression test.   ASSESSMENT:   1. Chronic left shoulder pain   2. Adhesive capsulitis of left shoulder   3. Hip flexor tightness, unspecified laterality     PROCEDURES:  None  PLAN:  Pertinent additional documentation may be included in corresponding procedure notes, imaging studies, problem based documentation and patient instructions.  Adhesive capsulitis of left shoulder Continue with home therapeutic exercises.   He is doing remarkably better.  He has tight hip flexors that are likely contributing to some of this and exacerbated by prolonged sitting.  Links to [employer] videos provided today per Patient Instructions.  These exercises were developed by Myles LippsEric Goodman, DC with a strong emphasis on core neuromuscular reducation and postural realignment through body-weight exercises.   Discussed the underlying features of tight hip flexors leading to crouched, fetal like position that results in spinal column compression.  Including lumbar hyperflexion with hypermobility, thoracic flexion with restrictive rotation and cervical lordosis reversal.   Activity modifications and the importance of avoiding exacerbating activities (limiting pain to no more than a 4 / 10 during or following activity) recommended and discussed.  Discussed red flag symptoms that warrant earlier emergent evaluation and patient voices understanding.  Return if symptoms worsen or fail to improve.  Gerda Diss, [facility] Physician

## 2018-01-21 NOTE — Patient Instructions (Addendum)

## 2018-01-21 NOTE — Assessment & Plan Note (Signed)
Continue with home therapeutic exercises.

## 2018-06-13 DIAGNOSIS — N200 Calculus of kidney: Secondary | ICD-10-CM | POA: Diagnosis not present

## 2018-06-13 DIAGNOSIS — N486 Induration penis plastica: Secondary | ICD-10-CM | POA: Diagnosis not present

## 2018-06-20 DIAGNOSIS — Z1211 Encounter for screening for malignant neoplasm of colon: Secondary | ICD-10-CM | POA: Diagnosis not present

## 2018-06-20 DIAGNOSIS — Z13220 Encounter for screening for lipoid disorders: Secondary | ICD-10-CM | POA: Diagnosis not present

## 2018-06-20 DIAGNOSIS — Z125 Encounter for screening for malignant neoplasm of prostate: Secondary | ICD-10-CM | POA: Diagnosis not present

## 2018-06-20 DIAGNOSIS — Z0000 Encounter for general adult medical examination without abnormal findings: Secondary | ICD-10-CM | POA: Diagnosis not present

## 2018-06-20 DIAGNOSIS — E119 Type 2 diabetes mellitus without complications: Secondary | ICD-10-CM | POA: Diagnosis not present

## 2018-09-02 ENCOUNTER — Encounter: Payer: Self-pay | Admitting: Sports Medicine

## 2018-09-03 DIAGNOSIS — L723 Sebaceous cyst: Secondary | ICD-10-CM | POA: Diagnosis not present

## 2018-09-03 DIAGNOSIS — L820 Inflamed seborrheic keratosis: Secondary | ICD-10-CM | POA: Diagnosis not present

## 2018-09-03 DIAGNOSIS — L814 Other melanin hyperpigmentation: Secondary | ICD-10-CM | POA: Diagnosis not present

## 2018-09-03 DIAGNOSIS — L570 Actinic keratosis: Secondary | ICD-10-CM | POA: Diagnosis not present

## 2018-09-03 DIAGNOSIS — L821 Other seborrheic keratosis: Secondary | ICD-10-CM | POA: Diagnosis not present

## 2018-09-22 NOTE — Progress Notes (Signed)
Tawana ScaleZach  D.O. [facility] [address] [place], [place] [address] Phone: [phone] Subjective:   Timothy [name], Timothy [name], am serving as a scribe for Dr. Antoine PrimasZachary .   CC: Right shoulder pain  BJY:[id number]:Subjective  Timothy RushRusty W Daralene [name] is a 63 y.o. male coming in with complaint of posterior, right shoulder pain. Dr. Berline Choughigby patient. Injected in November 2019 in left shoulder. Patient states right shoulder has been bothering him for past 6 months. Pain with flexion, ER and at night when he is sleeping. Does paddle and can get flare ups with this activity. Denies any radiating symptoms. Does use IBU and ice PRN.  Rates the severity of pain is 4 out of 10 that is ongoing.  Can be uncomfortable at night.     Past Medical History:  Diagnosis Date  . Kidney stones    Past Surgical History:  Procedure Laterality Date  . CHOLECYSTECTOMY    . HERNIA REPAIR    . KNEE SURGERY     Social History   Socioeconomic History  . Marital status: Married    Spouse name: Not on file  . Number of children: Not on file  . Years of education: Not on file  . Highest education level: Not on file  Occupational History  . Not on file  Social Needs  . Financial resource strain: Not on file  . Food insecurity    Worry: Not on file    Inability: Not on file  . Transportation needs    Medical: Not on file    Non-medical: Not on file  Tobacco Use  . Smoking status: Never Smoker  . Smokeless tobacco: Never Used  Substance and Sexual Activity  . Alcohol use: Yes    Comment: occ  . Drug use: No  . Sexual activity: Not on file  Lifestyle  . Physical activity    Days per week: Not on file    Minutes per session: Not on file  . Stress: Not on file  Relationships  . Social Musicianconnections    Talks on phone: Not on file    Gets together: Not on file    Attends religious service: Not on file    Active member of club or organization: Not on file    Attends meetings of clubs or organizations: Not on file     Relationship status: Not on file  Other Topics Concern  . Not on file  Social History Narrative  . Not on file   No Known Allergies No family history on file.  Current Outpatient Medications (Endocrine & Metabolic):  .  metFORMIN (GLUCOPHAGE) 500 MG tablet, Take 500 mg 1 day or 1 dose by mouth.         Past medical history, social, surgical and family history all reviewed in electronic medical record.  No pertanent information unless stated regarding to the chief complaint.   Review of Systems:  No headache, visual changes, nausea, vomiting, diarrhea, constipation, dizziness, abdominal pain, skin rash, fevers, chills, night sweats, weight loss, swollen lymph nodes, body aches, joint swelling, muscle aches, chest pain, shortness of breath, mood changes.   Objective  There were no vitals taken for this visit. Systems examined below as of    General: No apparent distress alert and oriented x3 mood and affect normal, dressed appropriately.  HEENT: Pupils equal, extraocular movements intact  Respiratory: Patient's speak in full sentences and does not appear short of breath  Cardiovascular: No lower extremity edema, non tender, no erythema  Skin: Warm dry intact with no signs of infection or rash on extremities or on axial skeleton.  Abdomen: Soft nontender  Neuro: Cranial nerves II through XII are intact, neurovascularly intact in all extremities with 2+ DTRs and 2+ pulses.  Lymph: No lymphadenopathy of posterior or anterior cervical chain or axillae bilaterally.  Gait normal with good balance and coordination.  MSK:  Non tender with full range of motion and good stability and symmetric strength and tone of  elbows, wrist, hip, knee and ankles bilaterally.  Shoulder: Right Inspection reveals no abnormalities, atrophy or asymmetry. Palpation is normal with no tenderness over AC joint or bicipital groove. ROM is full in all planes passively. Rotator cuff strength normal  throughout. signs of impingement with positive Neer and Hawkin's tests, but negative empty can sign. Speeds and Yergason's tests normal. No labral pathology noted with negative Obrien's, negative clunk and good stability. Normal scapular function observed. No painful arc and no drop arm sign. No apprehension sign  MSK US performed of: Right This study was ordered, performed, and interpreted by Charlann Boxer D.O.  Shoulder:   Supraspinatus:  Appears normal on long and transverse views, Bursal bulge seen with shoulder abduction on impingement view. Infraspinatus:  Appears normal on long and transverse views. Significant increase in Doppler flow Subscapularis:  Appears normal on long and transverse views. Positive bursa Teres Minor:  Appears normal on long and transverse views. AC joint:  Capsule undistended, no geyser sign. Glenohumeral Joint:  Appears normal without effusion. Glenoid Labrum:  Intact without visualized tears. Biceps Tendon:  Appears normal on long and transverse views, no fraying of tendon, tendon located in intertubercular groove, no subluxation with shoulder internal or external rotation.  Impression: Subacromial bursitis  Procedure: Real-time Ultrasound Guided Injection of right glenohumeral joint Device: GE Logiq E  Ultrasound guided injection is preferred based studies that show increased duration, increased effect, greater accuracy, decreased procedural pain, increased response rate with ultrasound guided versus blind injection.  Verbal informed consent obtained.  Time-out conducted.  Noted no overlying erythema, induration, or other signs of local infection.  Skin prepped in a sterile fashion.  Local anesthesia: Topical Ethyl chloride.  With sterile technique and under real time ultrasound guidance:  Joint visualized.  23g 1  inch needle inserted posterior approach. Pictures taken for needle placement. Patient did have injection of 2 cc of 1% lidocaine, 2 cc of 0.5%  Marcaine, and 1.0 cc of Kenalog 40 mg/dL. Completed without difficulty  Pain immediately resolved suggesting accurate placement of the medication.  Advised to call if fevers/chills, erythema, induration, drainage, or persistent bleeding.  Images permanently stored and available for review in the ultrasound unit.  Impression: Technically successful ultrasound guided injection.   Impression and Recommendations:     This case required medical decision making of moderate complexity. The above documentation has been reviewed and is accurate and complete Lyndal Pulley, DO       Note: This dictation was prepared with Dragon dictation along with smaller phrase technology. Any transcriptional errors that result from this process are unintentional.

## 2018-09-23 ENCOUNTER — Ambulatory Visit: Payer: Self-pay

## 2018-09-23 ENCOUNTER — Other Ambulatory Visit: Payer: Self-pay

## 2018-09-23 ENCOUNTER — Ambulatory Visit: Payer: BLUE CROSS/BLUE SHIELD | Admitting: Family Medicine

## 2018-09-23 ENCOUNTER — Encounter: Payer: Self-pay | Admitting: Family Medicine

## 2018-09-23 VITALS — BP 128/82 | HR 93 | Ht 70.0 in | Wt 228.0 lb

## 2018-09-23 DIAGNOSIS — G8929 Other chronic pain: Secondary | ICD-10-CM

## 2018-09-23 DIAGNOSIS — M7551 Bursitis of right shoulder: Secondary | ICD-10-CM | POA: Diagnosis not present

## 2018-09-23 NOTE — Patient Instructions (Signed)
Good to see you.  Take off paddling for 1 week Ice 20 minutes 2 times daily. Usually after activity and before bed. Exercises 3 times a week.  Turmeric 500mg  daily  Tart cherry extract 1200mg  at night Vitamin D 2000 IU daily  See me again in 4-6 weeks

## 2018-09-23 NOTE — Assessment & Plan Note (Signed)
Patient given injection.  Discussed icing regimen and home exercise.  Discussed which activities of doing things to avoid.  Patient is to increase activity slowly.  Discussed over-the-counter medications.  Follow-up with me again in 4 to 6 weeks worsening pain consider x-rays as well as formal physical therapy.

## 2018-11-18 ENCOUNTER — Ambulatory Visit: Payer: BC Managed Care – PPO | Admitting: Family Medicine

## 2018-12-02 ENCOUNTER — Encounter: Payer: Self-pay | Admitting: Family Medicine

## 2018-12-02 ENCOUNTER — Other Ambulatory Visit: Payer: Self-pay

## 2018-12-02 ENCOUNTER — Ambulatory Visit: Payer: Self-pay

## 2018-12-02 ENCOUNTER — Ambulatory Visit (INDEPENDENT_AMBULATORY_CARE_PROVIDER_SITE_OTHER): Payer: BC Managed Care – PPO | Admitting: Family Medicine

## 2018-12-02 ENCOUNTER — Ambulatory Visit (INDEPENDENT_AMBULATORY_CARE_PROVIDER_SITE_OTHER)
Admission: RE | Admit: 2018-12-02 | Discharge: 2018-12-02 | Disposition: A | Discharge status: Home / Self Care | Payer: BC Managed Care – PPO | Source: Ambulatory Visit | Attending: Family Medicine | Admitting: Family Medicine

## 2018-12-02 VITALS — BP 118/82 | HR 71 | Ht 70.0 in | Wt 217.0 lb

## 2018-12-02 DIAGNOSIS — G8929 Other chronic pain: Secondary | ICD-10-CM

## 2018-12-02 DIAGNOSIS — M25511 Pain in right shoulder: Secondary | ICD-10-CM

## 2018-12-02 DIAGNOSIS — M7501 Adhesive capsulitis of right shoulder: Secondary | ICD-10-CM | POA: Diagnosis not present

## 2018-12-02 MED ORDER — VITAMIN D (ERGOCALCIFEROL) 1.25 MG (50000 UNIT) PO CAPS: 50000.0000 [IU] | ORAL_CAPSULE | ORAL | 0 refills | Status: DC

## 2018-12-02 MED ORDER — MELOXICAM 15 MG PO TABS: 15.0000 mg | ORAL_TABLET | Freq: Every day | ORAL | 0 refills | Status: DC

## 2018-12-02 NOTE — Assessment & Plan Note (Signed)
Started having decreasing range of motion.  Likely contributing to the contralateral side and the pain the patient is having.  We discussed icing regimen, home exercise, vitamin D supplementation, would like to hold 4 to 6 weeks but if having worsening pain consider formal physical therapy and injection.

## 2018-12-02 NOTE — Patient Instructions (Signed)
Meloxicam 10 days daily then as needed PT will call you Xray downstairs See me in 5 weeks if not better will inject

## 2018-12-02 NOTE — Progress Notes (Signed)
[facility] [address] [place], [place] [address] Phone: [phone] Subjective:   Timothy [name], am serving as a scribe for Dr. Antoine Primas.  I'm seeing this patient by the request  of:    CC: Right shoulder pain  FYB:[id number]   09/23/2018 Patient given injection.  Discussed icing regimen and home exercise.  Discussed which activities of doing things to avoid.  Patient is to increase activity slowly.  Discussed over-the-counter medications.  Follow-up with me again in 4 to 6 weeks worsening pain consider x-rays as well as formal physical therapy.  Update 12/02/2018 Timothy [name] is a 63 y.o. male coming in with complaint of right shoulder. Pain is the same as last visit as he has not been able to perform his HEP. Achy pain throughout the day.  Patient has noticed now starting to have some decreasing range of motion.  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  Patient states that it feels very similar to that now at this moment.  States that there is no weakness just decreasing range of motion     Past Medical History:  Diagnosis Date  . Kidney stones    Past Surgical History:  Procedure Laterality Date  . CHOLECYSTECTOMY    . HERNIA REPAIR    . KNEE SURGERY     Social History   Socioeconomic History  . Marital status: Married    Spouse name: Not on file  . Number of children: Not on file  . Years of education: Not on file  . Highest education level: Not on file  Occupational History  . Not on file  Social Needs  . Financial resource strain: Not on file  . Food insecurity    Worry: Not on file    Inability: Not on file  . Transportation needs    Medical: Not on file    Non-medical: Not on file  Tobacco Use  . Smoking status: Never Smoker  . Smokeless tobacco: Never Used  Substance and Sexual Activity  . Alcohol use: Yes    Comment: occ  . Drug use: No  . Sexual activity: Not on file   Lifestyle  . Physical activity    Days per week: Not on file    Minutes per session: Not on file  . Stress: Not on file  Relationships  . Social Musician on phone: Not on file    Gets together: Not on file    Attends religious service: Not on file    Active member of club or organization: Not on file    Attends meetings of clubs or organizations: Not on file    Relationship status: Not on file  Other Topics Concern  . Not on file  Social History Narrative  . Not on file   No Known Allergies No family history on file.  Current Outpatient Medications (Endocrine & Metabolic):  .  metFORMIN (GLUCOPHAGE) 500 MG tablet, Take 500 mg 1 day or 1 dose by mouth.    Current Outpatient Medications (Analgesics):  .  meloxicam (MOBIC) 15 MG tablet, Take 1 tablet (15 mg total) by mouth daily.   Current Outpatient Medications (Other):  Marland Kitchen  Vitamin D, Ergocalciferol, (DRISDOL) 1.25 MG (50000 UT) CAPS capsule, Take 1 capsule (50,000 Units total) by mouth every 7 (seven) days.    Past medical history, social, surgical and family history all reviewed in electronic medical record.  No pertanent  information unless stated regarding to the chief complaint.   Review of Systems:  No headache, visual changes, nausea, vomiting, diarrhea, constipation, dizziness, abdominal pain, skin rash, fevers, chills, night sweats, weight loss, swollen lymph nodes, body aches, joint swelling, muscle aches, chest pain, shortness of breath, mood changes.    Objective  Blood pressure 118/82, pulse 71, height 5\' 10"  (1.778 m), weight 217 lb (98.4 kg), SpO2 95 %.    General: No apparent distress alert and oriented x3 mood and affect normal, dressed appropriately.  HEENT: Pupils equal, extraocular movements intact  Respiratory: Patient's speak in full sentences and does not appear short of breath  Cardiovascular: No lower extremity edema, non tender, no erythema  Skin: Warm dry intact with no signs of  infection or rash on extremities or on axial skeleton.  Abdomen: Soft nontender  Neuro: Cranial nerves II through XII are intact, neurovascularly intact in all extremities with 2+ DTRs and 2+ pulses.  Lymph: No lymphadenopathy of posterior or anterior cervical chain or axillae bilaterally.  Gait normal with good balance and coordination.  MSK:  Non tender with full range of motion and good stability and symmetric strength and tone of  elbows, wrist, hip, knee and ankles bilaterally.  Right shoulder exam shows the patient has some very mild atrophy compared to the contralateral side.  Does have some tenderness to palpation diffusely in the shoulder.  Patient does have limited range of motion lacking the last 10 degrees of external rotation.  Near full internal rotation.  5 out of 5 strength of the rotator cuff noted.       Impression and Recommendations:     This case required medical decision making of moderate complexity. The above documentation has been reviewed and is accurate and complete Lyndal Pulley, DO       Note: This dictation was prepared with Dragon dictation along with smaller phrase technology. Any transcriptional errors that result from this process are unintentional.

## 2018-12-17 ENCOUNTER — Encounter: Payer: Self-pay | Admitting: Physical Therapy

## 2018-12-17 ENCOUNTER — Ambulatory Visit (INDEPENDENT_AMBULATORY_CARE_PROVIDER_SITE_OTHER): Payer: BC Managed Care – PPO | Admitting: Physical Therapy

## 2018-12-17 ENCOUNTER — Other Ambulatory Visit: Payer: Self-pay

## 2018-12-17 DIAGNOSIS — M25511 Pain in right shoulder: Secondary | ICD-10-CM | POA: Diagnosis not present

## 2018-12-17 DIAGNOSIS — M25611 Stiffness of right shoulder, not elsewhere classified: Secondary | ICD-10-CM | POA: Diagnosis not present

## 2018-12-17 NOTE — Patient Instructions (Signed)
Access Code: [email or web address]  URL: [email or web address]  Date: 12/17/2018  Prepared by: Lyndee Hensen   Exercises Supine Shoulder Flexion with Dowel - 10 reps - 1 sets - 3x daily Supine Shoulder External Rotation in 45 Degrees Abduction AAROM with Dowel - 10 reps - 1 sets - 3x daily Supine Shoulder External Rotation with Dowel - 10 reps - 1 sets - 3x daily Standing Shoulder Internal Rotation Stretch with Hands Behind Back - 5 reps - 5 hold - 3x daily

## 2018-12-17 NOTE — Therapy (Signed)
[facility] [address] [place], [place], [address] Phone: [phone]   Fax:  [phone]  Physical Therapy Evaluation  Patient Details  Name: Timothy [name] MRN: [id number] Date of Birth: 03/09/1955 Referring Provider (PT): Charlann Boxer   Encounter Date: 12/17/2018  PT End of Session - 12/17/18 0944    Visit Number  1    Number of Visits  12    Date for PT Re-Evaluation  01/28/19    Authorization Type  BCBS    PT Start Time  [phone]    PT Stop Time  0930    PT Time Calculation (min)  38 min    Activity Tolerance  Patient tolerated treatment well    Behavior During Therapy  [facility] for tasks assessed/performed       Past Medical History:  Diagnosis Date  . Kidney stones     Past Surgical History:  Procedure Laterality Date  . CHOLECYSTECTOMY    . HERNIA REPAIR    . KNEE SURGERY      There were no vitals filed for this visit.   Subjective Assessment - 12/17/18 0857    Subjective  Pt states ongoing pain in R shoulder, frozen shoulder. Had injection in August, helped some, but still sore and stiff. He also had frozen shoulder previously on L, cleared up quickly. He is Retired builds [employer], likes boating. States difficulty with reaching, elevation, and sleeping, due to pain and stiffness.    Pertinent History  DM 2    Limitations  Lifting;House hold activities    Patient Stated Goals  Decreased pain, improved ROM    Currently in Pain?  Yes    Pain Score  8     Pain Location  Shoulder    Pain Orientation  Right    Pain Descriptors / Indicators  Aching    Pain Type  Acute pain    Pain Radiating Towards  3-4 pain at rest.    Pain Onset  More than a month ago    Pain Frequency  Constant    Aggravating Factors   reaching, lifting, elevation    Pain Relieving Factors  none stated         [facility] PT Assessment - 12/17/18 0001      Assessment   Medical Diagnosis  R shoulder Pain    Referring Provider (PT)  Charlann Boxer    Hand  Dominance  Right    Next MD Visit  01/07/19      Balance Screen   Has the patient fallen in the past 6 months  Yes    How many times?  1    Has the patient had a decrease in activity level because of a fear of falling?   No    Is the patient reluctant to leave their home because of a fear of falling?   No      Prior Function   Level of Independence  Independent      Cognition   Overall Cognitive Status  Within Functional Limits for tasks assessed      ROM / Strength   AROM / PROM / Strength  AROM;Strength;PROM      AROM   Overall AROM Comments  IR behind back, to level of sacrum    AROM Assessment Site  Shoulder    Right/Left Shoulder  Right    Right Shoulder Flexion  125 Degrees    Right Shoulder ABduction  90 Degrees    Right  Shoulder Internal Rotation  70 Degrees    Right Shoulder External Rotation  70 Degrees      PROM   PROM Assessment Site  Shoulder    Right/Left Shoulder  Right    Right Shoulder Flexion  150 Degrees    Right Shoulder ABduction  140 Degrees    Right Shoulder Internal Rotation  70 Degrees    Right Shoulder External Rotation  70 Degrees      Strength   Strength Assessment Site  Shoulder    Right/Left Shoulder  Right    Right Shoulder Flexion  3-/5    Right Shoulder ABduction  3-/5    Right Shoulder Internal Rotation  4/5    Right Shoulder External Rotation  4-/5      Palpation   Palpation comment  Hypomobile R GHJ                Objective measurements completed on examination: See above findings.      [facility] Adult PT Treatment/Exercise - 12/17/18 0001      Exercises   Exercises  Shoulder      Shoulder Exercises: Supine   External Rotation  10 reps    External Rotation Limitations  cane    Flexion  10 reps    Flexion Limitations  cane      Shoulder Exercises: Pulleys   Flexion  2 minutes    Scaption  1 minute      Shoulder Exercises: Stretch   Internal Rotation Stretch  10 seconds    Internal Rotation Stretch Limitations   2 hands behind back x10      Manual Therapy   Manual Therapy  Joint mobilization;Passive ROM    Joint Mobilization  R GHJ inf and post mobs    Passive ROM  R shoulder, all motions, Subscap release              PT Education - 12/17/18 0943    Education Details  HEP, Exam findings, PT POC    Person(s) Educated  Patient    Methods  Explanation;Handout;Demonstration;Tactile cues;Verbal cues    Comprehension  Verbalized understanding;Returned demonstration;Verbal cues required;Need further instruction       PT Short Term Goals - 12/17/18 0945      PT SHORT TERM GOAL #1   Title  Pt to be independent with initial HEP for shoulder ROM    Time  2    Period  Weeks    Status  New    Target Date  12/31/18      PT SHORT TERM GOAL #2   Title  Pt to demo improved shoulder abd by at least 10 deg, AROM.    Time  2    Period  Weeks    Status  New    Target Date  12/31/18        PT Long Term Goals - 12/17/18 0946      PT LONG TERM GOAL #1   Title  Pt to report decreased pain to 0-2/10 with R UE activity, reaching, elevation    Time  6    Period  Weeks    Status  New    Target Date  01/28/19      PT LONG TERM GOAL #2   Title  Pt to demo improved AROM of R shoulder to be WNL to improve ability for IADLs.    Time  6    Period  Weeks    Status  New  Target Date  01/28/19      PT LONG TERM GOAL #3   Title  Pt to demo increased strength of R shoulder to at least 4+/5 to improve ability for reaching, lifting, carry, and IADLs.    Time  6    Period  Weeks    Status  New    Target Date  01/28/19      PT LONG TERM GOAL #4   Title  Pt to be independent with final HEP for SHoulder ROm and strength    Time  6    Period  Weeks    Status  New    Target Date  01/28/19             Plan - 12/17/18 0948    Clinical Impression Statement  Pt presents with primary complaint of increased pain and stiffness in R shoulder, consistent with Adhesive Capsulitis. He has GHJ  stiffness that is limiting all motions, with pain at end ranges. He is most limited with ABD (arom). Pt with strength deficits for elevation above 90 deg. Pt with decreased ability for full functional activities due to pain and stiffness. Pt to benefit from skilled PT to improve deficits and pain.    Personal Factors and Comorbidities  Comorbidity 1    Comorbidities  DM    Examination-Activity Limitations  Reach Overhead;Sleep;Carry;Hygiene/Grooming;Lift    Examination-Participation Restrictions  Cleaning;Community Activity;Driving;Yard Work    Stability/Clinical Decision Making  Stable/Uncomplicated    Clinical Decision Making  Low    Rehab Potential  Good    PT Frequency  2x / week    PT Duration  6 weeks    PT Treatment/Interventions  ADLs/Self Care Home Management;Cryotherapy;Electrical Stimulation;DME Instruction;Ultrasound;Traction;Moist Heat;Iontophoresis 4mg /ml Dexamethasone;Functional mobility training;Therapeutic activities;Therapeutic exercise;Neuromuscular re-education;Manual techniques;Patient/family education;Passive range of motion;Dry needling;Taping;Spinal Manipulations;Joint Manipulations    Consulted and Agree with Plan of Care  Patient       Patient will benefit from skilled therapeutic intervention in order to improve the following deficits and impairments:  Decreased range of motion, Impaired UE functional use, Increased muscle spasms, Decreased endurance, Decreased activity tolerance, Pain, Hypomobility, Impaired flexibility, Decreased mobility, Decreased strength  Visit Diagnosis: Acute pain of right shoulder  Stiffness of right shoulder, not elsewhere classified     Problem List Patient Active Problem List   Diagnosis Date Noted  . Adhesive capsulitis of right shoulder 12/02/2018  . Acute bursitis of right shoulder 09/23/2018  . Adhesive capsulitis of left shoulder 12/10/2017  . Spondylosis of cervical region without myelopathy or radiculopathy 12/10/2017  .  Kidney stones 04/01/2017  . Bilateral Achilles tendonosis 12/10/2016    Sedalia MutaLauren Nesa Distel, PT, DPT 9:58 AM  12/17/18    [facility] [address] [address] [place], [place], [address] Phone: [phone]   Fax:  [phone]  Name: Riccardo DubinRusty W Friedel MRN: [id number] Date of Birth: 11/18/1955

## 2018-12-23 ENCOUNTER — Encounter: Payer: Self-pay | Admitting: Physical Therapy

## 2018-12-23 ENCOUNTER — Ambulatory Visit (INDEPENDENT_AMBULATORY_CARE_PROVIDER_SITE_OTHER): Payer: BC Managed Care – PPO | Admitting: Physical Therapy

## 2018-12-23 ENCOUNTER — Other Ambulatory Visit: Payer: Self-pay

## 2018-12-23 DIAGNOSIS — M25511 Pain in right shoulder: Secondary | ICD-10-CM

## 2018-12-23 DIAGNOSIS — M25611 Stiffness of right shoulder, not elsewhere classified: Secondary | ICD-10-CM

## 2018-12-23 NOTE — Therapy (Signed)
[facility] [address] [place], [place], [address] Phone: [phone]   Fax:  [phone]  Physical Therapy Treatment  Patient Details  Name: Timothy [name] MRN: [id number] Date of Birth: September 28, 1955 Referring Provider (PT): Terrilee Files   Encounter Date: 12/23/2018  PT End of Session - 12/23/18 0845    Visit Number  2    Number of Visits  12    Date for PT Re-Evaluation  01/28/19    Authorization Type  BCBS    PT Start Time  0840    PT Stop Time  0915    PT Time Calculation (min)  35 min    Activity Tolerance  Patient tolerated treatment well    Behavior During Therapy  [facility] for tasks assessed/performed       Past Medical History:  Diagnosis Date  . Kidney stones     Past Surgical History:  Procedure Laterality Date  . CHOLECYSTECTOMY    . HERNIA REPAIR    . KNEE SURGERY      There were no vitals filed for this visit.  Subjective Assessment - 12/23/18 0839    Subjective  Pt states doing HEP, is sore.    Currently in Pain?  Yes    Pain Score  4     Pain Location  Shoulder    Pain Orientation  Right    Pain Descriptors / Indicators  Aching    Pain Type  Acute pain    Pain Onset  More than a month ago    Pain Frequency  Intermittent                       [facility] Adult PT Treatment/Exercise - 12/23/18 0846      Exercises   Exercises  Shoulder      Shoulder Exercises: Supine   External Rotation  10 reps    External Rotation Limitations  cane    Flexion  20 reps    Flexion Limitations  cane    Other Supine Exercises  ER/IR 2lb strength x20;       Shoulder Exercises: Standing   Flexion  AROM;10 reps    Row  20 reps    Theraband Level (Shoulder Row)  Level 3 (Green)    Other Standing Exercises  Wall slides, circles, x20 each;       Shoulder Exercises: Pulleys   Flexion  2 minutes    Scaption  1 minute      Shoulder Exercises: Stretch   Corner Stretch Limitations  pain    Internal Rotation  Stretch  10 seconds    Internal Rotation Stretch Limitations  Strap  10 sec x5;     Table Stretch - Flexion  2 reps;30 seconds      Manual Therapy   Manual Therapy  Joint mobilization;Passive ROM    Joint Mobilization  R GHJ inf and post mobs, IR behind back MWM    Passive ROM  R shoulder, all motions,                PT Short Term Goals - 12/17/18 0945      PT SHORT TERM GOAL #1   Title  Pt to be independent with initial HEP for shoulder ROM    Time  2    Period  Weeks    Status  New    Target Date  12/31/18      PT SHORT TERM GOAL #2  Title  Pt to demo improved shoulder abd by at least 10 deg, AROM.    Time  2    Period  Weeks    Status  New    Target Date  12/31/18        PT Long Term Goals - 12/17/18 0946      PT LONG TERM GOAL #1   Title  Pt to report decreased pain to 0-2/10 with R UE activity, reaching, elevation    Time  6    Period  Weeks    Status  New    Target Date  01/28/19      PT LONG TERM GOAL #2   Title  Pt to demo improved AROM of R shoulder to be WNL to improve ability for IADLs.    Time  6    Period  Weeks    Status  New    Target Date  01/28/19      PT LONG TERM GOAL #3   Title  Pt to demo increased strength of R shoulder to at least 4+/5 to improve ability for reaching, lifting, carry, and IADLs.    Time  6    Period  Weeks    Status  New    Target Date  01/28/19      PT LONG TERM GOAL #4   Title  Pt to be independent with final HEP for SHoulder ROm and strength    Time  6    Period  Weeks    Status  New    Target Date  01/28/19            Plan - 12/23/18 0920    Clinical Impression Statement  Pt with mild improvements of ROM for flex, abd and IR. He does have pain with most motions at end range. Discussed frequenty, light ROM at home for HEP. Plan to progress as tolerated.    Personal Factors and Comorbidities  Comorbidity 1    Comorbidities  DM    Examination-Activity Limitations  Reach  Overhead;Sleep;Carry;Hygiene/Grooming;Lift    Examination-Participation Restrictions  Cleaning;Community Activity;Driving;Yard Work    Stability/Clinical Decision Making  Stable/Uncomplicated    Rehab Potential  Good    PT Frequency  2x / week    PT Duration  6 weeks    PT Treatment/Interventions  ADLs/Self Care Home Management;Cryotherapy;Electrical Stimulation;DME Instruction;Ultrasound;Traction;Moist Heat;Iontophoresis 4mg /ml Dexamethasone;Functional mobility training;Therapeutic activities;Therapeutic exercise;Neuromuscular re-education;Manual techniques;Patient/family education;Passive range of motion;Dry needling;Taping;Spinal Manipulations;Joint Manipulations    Consulted and Agree with Plan of Care  Patient       Patient will benefit from skilled therapeutic intervention in order to improve the following deficits and impairments:  Decreased range of motion, Impaired UE functional use, Increased muscle spasms, Decreased endurance, Decreased activity tolerance, Pain, Hypomobility, Impaired flexibility, Decreased mobility, Decreased strength  Visit Diagnosis: Acute pain of right shoulder  Stiffness of right shoulder, not elsewhere classified     Problem List Patient Active Problem List   Diagnosis Date Noted  . Adhesive capsulitis of right shoulder 12/02/2018  . Acute bursitis of right shoulder 09/23/2018  . Adhesive capsulitis of left shoulder 12/10/2017  . Spondylosis of cervical region without myelopathy or radiculopathy 12/10/2017  . Kidney stones 04/01/2017  . Bilateral Achilles tendonosis 12/10/2016   Lyndee Hensen, PT, DPT 9:22 AM  12/23/18    [facility] [address] [place], [place], [address] Phone: [phone]   Fax:  [phone]  Name: Timothy [name] MRN: [id number] Date of Birth:  01/24/1956   

## 2018-12-25 ENCOUNTER — Other Ambulatory Visit: Payer: Self-pay

## 2018-12-25 ENCOUNTER — Ambulatory Visit (INDEPENDENT_AMBULATORY_CARE_PROVIDER_SITE_OTHER): Payer: BC Managed Care – PPO | Admitting: Physical Therapy

## 2018-12-25 ENCOUNTER — Encounter: Payer: Self-pay | Admitting: Physical Therapy

## 2018-12-25 DIAGNOSIS — M25512 Pain in left shoulder: Secondary | ICD-10-CM

## 2018-12-25 DIAGNOSIS — M25511 Pain in right shoulder: Secondary | ICD-10-CM | POA: Diagnosis not present

## 2018-12-25 DIAGNOSIS — G8929 Other chronic pain: Secondary | ICD-10-CM

## 2018-12-25 DIAGNOSIS — M25611 Stiffness of right shoulder, not elsewhere classified: Secondary | ICD-10-CM | POA: Diagnosis not present

## 2018-12-25 NOTE — Therapy (Signed)
[facility] [address] [place], [place], [address] Phone: [phone]   Fax:  [phone]  Physical Therapy Treatment  Patient Details  Name: Timothy [name] MRN: [id number] Date of Birth: 1955-09-20 Referring Provider (PT): Terrilee Files   Encounter Date: 12/25/2018  PT End of Session - 12/25/18 0853    Visit Number  3    Number of Visits  12    Date for PT Re-Evaluation  01/28/19    Authorization Type  BCBS    PT Start Time  0846    PT Stop Time  0928    PT Time Calculation (min)  42 min    Activity Tolerance  Patient tolerated treatment well    Behavior During Therapy  [facility] for tasks assessed/performed       Past Medical History:  Diagnosis Date  . Kidney stones     Past Surgical History:  Procedure Laterality Date  . CHOLECYSTECTOMY    . HERNIA REPAIR    . KNEE SURGERY      There were no vitals filed for this visit.  Subjective Assessment - 12/25/18 0852    Subjective  No new complaints.    Currently in Pain?  Yes    Pain Score  4     Pain Location  Shoulder    Pain Orientation  Right    Pain Type  Acute pain    Pain Onset  More than a month ago    Pain Frequency  Intermittent                       [facility] Adult PT Treatment/Exercise - 12/25/18 0854      Exercises   Exercises  Shoulder      Shoulder Exercises: Supine   External Rotation  --    External Rotation Limitations  --    Flexion  20 reps    Flexion Limitations  cane    Other Supine Exercises  --      Shoulder Exercises: Standing   Flexion  --    Row  20 reps    Theraband Level (Shoulder Row)  Level 3 (Green)    Other Standing Exercises  Wall slides, , x20 each;       Shoulder Exercises: Pulleys   Flexion  2 minutes    Scaption  1 minute      Shoulder Exercises: ROM/Strengthening   UBE (Upper Arm Bike)  Fluid cycle x5 min      Shoulder Exercises: Stretch   Corner Stretch Limitations  --    Internal Rotation Stretch  --     Internal Rotation Stretch Limitations  --    Table Stretch - Flexion  --      Manual Therapy   Manual Therapy  Joint mobilization;Passive ROM;Soft tissue mobilization    Manual therapy comments  skilled palpation and monitoring of soft tissue with dry needling.     Joint Mobilization  R GHJ inf and post mobs,     Soft tissue mobilization  STM/DTM to Teres and post delt     Passive ROM  R shoulder, all motions,        Trigger Point Dry Needling - 12/25/18 0930    Consent Given?  Yes    Education Handout Provided  Yes    Muscles Treated Upper Quadrant  Deltoid;Teres major;Teres minor    Deltoid Response  Palpable increased muscle length   post and mid delt   Teres  major Response  Palpable increased muscle length    Teres minor Response  Palpable increased muscle length             PT Short Term Goals - 12/17/18 0945      PT SHORT TERM GOAL #1   Title  Pt to be independent with initial HEP for shoulder ROM    Time  2    Period  Weeks    Status  New    Target Date  12/31/18      PT SHORT TERM GOAL #2   Title  Pt to demo improved shoulder abd by at least 10 deg, AROM.    Time  2    Period  Weeks    Status  New    Target Date  12/31/18        PT Long Term Goals - 12/17/18 0946      PT LONG TERM GOAL #1   Title  Pt to report decreased pain to 0-2/10 with R UE activity, reaching, elevation    Time  6    Period  Weeks    Status  New    Target Date  01/28/19      PT LONG TERM GOAL #2   Title  Pt to demo improved AROM of R shoulder to be WNL to improve ability for IADLs.    Time  6    Period  Weeks    Status  New    Target Date  01/28/19      PT LONG TERM GOAL #3   Title  Pt to demo increased strength of R shoulder to at least 4+/5 to improve ability for reaching, lifting, carry, and IADLs.    Time  6    Period  Weeks    Status  New    Target Date  01/28/19      PT LONG TERM GOAL #4   Title  Pt to be independent with final HEP for SHoulder ROm and  strength    Time  6    Period  Weeks    Status  New    Target Date  01/28/19            Plan - 12/25/18 1352    Clinical Impression Statement  Pt with soreness in teres and posterior delt, addressed wtih DTM and DN today. Pt with stiffness with Flexion PROM, improving rotation. Plan to continue manual for ROM and pain, and progress strength.    Personal Factors and Comorbidities  Comorbidity 1    Comorbidities  DM    Examination-Activity Limitations  Reach Overhead;Sleep;Carry;Hygiene/Grooming;Lift    Examination-Participation Restrictions  Cleaning;Community Activity;Driving;Yard Work    Stability/Clinical Decision Making  Stable/Uncomplicated    Rehab Potential  Good    PT Frequency  2x / week    PT Duration  6 weeks    PT Treatment/Interventions  ADLs/Self Care Home Management;Cryotherapy;Electrical Stimulation;DME Instruction;Ultrasound;Traction;Moist Heat;Iontophoresis 4mg /ml Dexamethasone;Functional mobility training;Therapeutic activities;Therapeutic exercise;Neuromuscular re-education;Manual techniques;Patient/family education;Passive range of motion;Dry needling;Taping;Spinal Manipulations;Joint Manipulations    Consulted and Agree with Plan of Care  Patient       Patient will benefit from skilled therapeutic intervention in order to improve the following deficits and impairments:  Decreased range of motion, Impaired UE functional use, Increased muscle spasms, Decreased endurance, Decreased activity tolerance, Pain, Hypomobility, Impaired flexibility, Decreased mobility, Decreased strength  Visit Diagnosis: Acute pain of right shoulder  Stiffness of right shoulder, not elsewhere classified  Chronic left shoulder pain  Problem List Patient Active Problem List   Diagnosis Date Noted  . Adhesive capsulitis of right shoulder 12/02/2018  . Acute bursitis of right shoulder 09/23/2018  . Adhesive capsulitis of left shoulder 12/10/2017  . Spondylosis of cervical  region without myelopathy or radiculopathy 12/10/2017  . Kidney stones 04/01/2017  . Bilateral Achilles tendonosis 12/10/2016    Sedalia MutaLauren Aijalon Kirtz, PT, DPT 1:53 PM  12/25/18    [facility] [address] [place], [place], [address] Phone: [phone]   Fax:  [phone]  Name: Timothy [name] MRN: [id number] Date of Birth: 06/16/1955

## 2018-12-26 ENCOUNTER — Other Ambulatory Visit: Payer: Self-pay

## 2018-12-29 ENCOUNTER — Other Ambulatory Visit: Payer: Self-pay

## 2018-12-29 ENCOUNTER — Encounter: Payer: Self-pay | Admitting: Physical Therapy

## 2018-12-29 ENCOUNTER — Ambulatory Visit (INDEPENDENT_AMBULATORY_CARE_PROVIDER_SITE_OTHER): Payer: BC Managed Care – PPO | Admitting: Physical Therapy

## 2018-12-29 DIAGNOSIS — M25611 Stiffness of right shoulder, not elsewhere classified: Secondary | ICD-10-CM

## 2018-12-29 DIAGNOSIS — M25511 Pain in right shoulder: Secondary | ICD-10-CM | POA: Diagnosis not present

## 2018-12-29 NOTE — Therapy (Signed)
[facility] [address] [address] , [place], [address] Phone: [phone]   Fax:  [phone]  Physical Therapy Treatment  Patient Details  Name: Timothy [name] MRN: [id number] Date of Birth: 11/16/1955 Referring Provider (PT): Terrilee FilesZach Smith   Encounter Date: 12/29/2018  PT End of Session - 12/29/18 0856    Visit Number  4    Number of Visits  12    Date for PT Re-Evaluation  01/28/19    Authorization Type  BCBS    PT Start Time  0803    PT Stop Time  0845    PT Time Calculation (min)  42 min    Activity Tolerance  Patient tolerated treatment well    Behavior During Therapy  [facility]/per[facility]       Past Medical History:  Diagnosis Date  . Kidney stones     Past Surgical History:  Procedure Laterality Date  . CHOLECYSTECTOMY    . HERNIA REPAIR    . KNEE SURGERY      There were no vitals filed for this visit.  Subjective Assessment - 12/29/18 0856    Subjective  Pt states reaching behind is still painful, as well as getting dressed. He is sleeping better.    Currently in Pain?  Yes    Pain Score  3     Pain Location  Shoulder    Pain Orientation  Right    Pain Descriptors / Indicators  Aching    Pain Type  Acute pain    Pain Onset  More than a month ago    Pain Frequency  Intermittent                       [facility] Adult PT Treatment/Exercise - 12/29/18 0805      Exercises   Exercises  Shoulder      Shoulder Exercises: Supine   Flexion  20 reps    Flexion Limitations  cane    Other Supine Exercises  ER/IR 2lb strength x20;       Shoulder Exercises: Standing   External Rotation  20 reps    Theraband Level (Shoulder External Rotation)  Level 3 (Green)    Internal Rotation  20 reps    Theraband Level (Shoulder Internal Rotation)  Level 3 (Green)    Flexion  10 reps    Shoulder Flexion Weight (lbs)  2lb    ABduction  10 reps    Shoulder ABduction Weight (lbs)  2lb    Row  20 reps    Theraband Level (Shoulder Row)  Level 3 (Green)    Other Standing Exercises  Wall slides,  flex and abd, x15 each;       Shoulder Exercises: Pulleys   Flexion  --    Scaption  --      Shoulder Exercises: ROM/Strengthening   UBE (Upper Arm Bike)  Fluid cycle x4  min      Shoulder Exercises: Stretch   Internal Rotation Stretch  4 reps    Internal Rotation Stretch Limitations  10 sec, S/L sleeper stretch       Manual Therapy   Manual Therapy  Joint mobilization;Passive ROM;Soft tissue mobilization    Manual therapy comments  --    Joint Mobilization  R GHJ inf and post mobs,     Soft tissue mobilization  --    Passive ROM  R shoulder, all motions,  PT Short Term Goals - 12/17/18 0945      PT SHORT TERM GOAL #1   Title  Pt to be independent with initial HEP for shoulder ROM    Time  2    Period  Weeks    Status  New    Target Date  12/31/18      PT SHORT TERM GOAL #2   Title  Pt to demo improved shoulder abd by at least 10 deg, AROM.    Time  2    Period  Weeks    Status  New    Target Date  12/31/18        PT Long Term Goals - 12/17/18 0946      PT LONG TERM GOAL #1   Title  Pt to report decreased pain to 0-2/10 with R UE activity, reaching, elevation    Time  6    Period  Weeks    Status  New    Target Date  01/28/19      PT LONG TERM GOAL #2   Title  Pt to demo improved AROM of R shoulder to be WNL to improve ability for IADLs.    Time  6    Period  Weeks    Status  New    Target Date  01/28/19      PT LONG TERM GOAL #3   Title  Pt to demo increased strength of R shoulder to at least 4+/5 to improve ability for reaching, lifting, carry, and IADLs.    Time  6    Period  Weeks    Status  New    Target Date  01/28/19      PT LONG TERM GOAL #4   Title  Pt to be independent with final HEP for SHoulder ROm and strength    Time  6    Period  Weeks    Status  New    Target Date  01/28/19            Plan - 12/29/18 0858     Clinical Impression Statement  Pt showing good progress overall. He has much improved ability for AROM, still has mild tightness and soreness at end ranged for flexion and ER. Will continue to progress strength and work towards ful/pain free ROM.    Personal Factors and Comorbidities  Comorbidity 1    Comorbidities  DM    Examination-Activity Limitations  Reach Overhead;Sleep;Carry;Hygiene/Grooming;Lift    Examination-Participation Restrictions  Cleaning;Community Activity;Driving;Yard Work    Stability/Clinical Decision Making  Stable/Uncomplicated    Rehab Potential  Good    PT Frequency  2x / week    PT Duration  6 weeks    PT Treatment/Interventions  ADLs/Self Care Home Management;Cryotherapy;Electrical Stimulation;DME Instruction;Ultrasound;Traction;Moist Heat;Iontophoresis 4mg /ml Dexamethasone;Functional mobility training;Therapeutic activities;Therapeutic exercise;Neuromuscular re-education;Manual techniques;Patient/family education;Passive range of motion;Dry needling;Taping;Spinal Manipulations;Joint Manipulations    Consulted and Agree with Plan of Care  Patient       Patient will benefit from skilled therapeutic intervention in order to improve the following deficits and impairments:  Decreased range of motion, Impaired UE functional use, Increased muscle spasms, Decreased endurance, Decreased activity tolerance, Pain, Hypomobility, Impaired flexibility, Decreased mobility, Decreased strength  Visit Diagnosis: Acute pain of right shoulder  Stiffness of right shoulder, not elsewhere classified     Problem List Patient Active Problem List   Diagnosis Date Noted  . Adhesive capsulitis of right shoulder 12/02/2018  . Acute bursitis of right shoulder 09/23/2018  . Adhesive  capsulitis of left shoulder 12/10/2017  . Spondylosis of cervical region without myelopathy or radiculopathy 12/10/2017  . Kidney stones 04/01/2017  . Bilateral Achilles tendonosis 12/10/2016    Lyndee Hensen, PT, DPT 8:59 AM  12/29/18    [facility] [place], [place], [address] Phone: [phone]   Fax:  [phone]  Name: Timothy [name] MRN: [id number] Date of Birth: Jul 02, 1955

## 2018-12-31 ENCOUNTER — Encounter: Payer: Self-pay | Admitting: Physical Therapy

## 2018-12-31 ENCOUNTER — Other Ambulatory Visit: Payer: Self-pay

## 2018-12-31 ENCOUNTER — Ambulatory Visit (INDEPENDENT_AMBULATORY_CARE_PROVIDER_SITE_OTHER): Payer: BC Managed Care – PPO | Admitting: Physical Therapy

## 2018-12-31 DIAGNOSIS — M25611 Stiffness of right shoulder, not elsewhere classified: Secondary | ICD-10-CM

## 2018-12-31 DIAGNOSIS — M25511 Pain in right shoulder: Secondary | ICD-10-CM

## 2018-12-31 NOTE — Therapy (Signed)
[facility] [address] [address] [place], [place], [address] Phone: [phone]   Fax:  [phone]  Physical Therapy Treatment  Patient Details  Name: Timothy [name] MRN: [id number] Date of Birth: 10/08/1955 Referring Provider (PT): Terrilee FilesZach Smith   Encounter Date: 12/31/2018  PT End of Session - 12/31/18 1422    Visit Number  5    Number of Visits  12    Date for PT Re-Evaluation  01/28/19    Authorization Type  BCBS    PT Start Time  0848    PT Stop Time  0928    PT Time Calculation (min)  40 min    Activity Tolerance  Patient tolerated treatment well    Behavior During Therapy  [facility]       Past Medical History:  Diagnosis Date  . Kidney stones     Past Surgical History:  Procedure Laterality Date  . CHOLECYSTECTOMY    . HERNIA REPAIR    . KNEE SURGERY      There were no vitals filed for this visit.  Subjective Assessment - 12/31/18 1421    Subjective  Pt states sorness today, in anterior and posterior shoulder.    Currently in Pain?  Yes    Pain Score  4     Pain Location  Shoulder    Pain Orientation  Right    Pain Descriptors / Indicators  Aching    Pain Type  Acute pain    Pain Onset  More than a month ago    Pain Frequency  Intermittent                       [facility] Adult PT Treatment/Exercise - 12/31/18 0855      Exercises   Exercises  Shoulder      Shoulder Exercises: Supine   Flexion  20 reps    Flexion Limitations  cane ,     Other Supine Exercises  ER/IR 2lb strength x20;     Other Supine Exercises  Horiz abd x15 2lb      Shoulder Exercises: Standing   External Rotation  20 reps    Theraband Level (Shoulder External Rotation)  Level 3 (Green)    Internal Rotation  20 reps    Theraband Level (Shoulder Internal Rotation)  Level 3 (Green)    Flexion  10 reps    Shoulder Flexion Weight (lbs)  --    ABduction  --    Shoulder ABduction Weight (lbs)  --    Row  20  reps    Theraband Level (Shoulder Row)  Level 3 (Green)    Other Standing Exercises  --      Shoulder Exercises: ROM/Strengthening   UBE (Upper Arm Bike)  Fluid cycle x4  min      Shoulder Exercises: Stretch   Internal Rotation Stretch  --    Internal Rotation Stretch Limitations  --    Other Shoulder Stretches  Supine ER butterfly stretch 5 sec x15;       Modalities   Modalities  Iontophoresis      Iontophoresis   Type of Iontophoresis  Dexamethasone    Location  R anterior shoulder    Time  6 hr patch      Manual Therapy   Manual Therapy  Joint mobilization;Passive ROM;Soft tissue mobilization    Joint Mobilization  R GHJ inf and post mobs,     Passive ROM  R shoulder, all motions,                PT Short Term Goals - 12/31/18 1422      PT SHORT TERM GOAL #1   Title  Pt to be independent with initial HEP for shoulder ROM    Time  2    Period  Weeks    Status  Achieved    Target Date  12/31/18      PT SHORT TERM GOAL #2   Title  Pt to demo improved shoulder abd by at least 10 deg, AROM.    Time  2    Period  Weeks    Status  Achieved    Target Date  12/31/18        PT Long Term Goals - 12/17/18 0946      PT LONG TERM GOAL #1   Title  Pt to report decreased pain to 0-2/10 with R UE activity, reaching, elevation    Time  6    Period  Weeks    Status  New    Target Date  01/28/19      PT LONG TERM GOAL #2   Title  Pt to demo improved AROM of R shoulder to be WNL to improve ability for IADLs.    Time  6    Period  Weeks    Status  New    Target Date  01/28/19      PT LONG TERM GOAL #3   Title  Pt to demo increased strength of R shoulder to at least 4+/5 to improve ability for reaching, lifting, carry, and IADLs.    Time  6    Period  Weeks    Status  New    Target Date  01/28/19      PT LONG TERM GOAL #4   Title  Pt to be independent with final HEP for SHoulder ROm and strength    Time  6    Period  Weeks    Status  New    Target Date   01/28/19            Plan - 12/31/18 1423    Clinical Impression Statement  Pt with slight increase in soreness today. Ionto added to anterior shoulder for pain. Continues to have soreness at end range of flexion and ER. ROm improving, Full flexion still not achieved without pain. Pt to benefit from continued care    Personal Factors and Comorbidities  Comorbidity 1    Comorbidities  DM    Examination-Activity Limitations  Reach Overhead;Sleep;Carry;Hygiene/Grooming;Lift    Examination-Participation Restrictions  Cleaning;Community Activity;Driving;Yard Work    Stability/Clinical Decision Making  Stable/Uncomplicated    Rehab Potential  Good    PT Frequency  2x / week    PT Duration  6 weeks    PT Treatment/Interventions  ADLs/Self Care Home Management;Cryotherapy;Electrical Stimulation;DME Instruction;Ultrasound;Traction;Moist Heat;Iontophoresis 4mg /ml Dexamethasone;Functional mobility training;Therapeutic activities;Therapeutic exercise;Neuromuscular re-education;Manual techniques;Patient/family education;Passive range of motion;Dry needling;Taping;Spinal Manipulations;Joint Manipulations    Consulted and Agree with Plan of Care  Patient       Patient will benefit from skilled therapeutic intervention in order to improve the following deficits and impairments:  Decreased range of motion, Impaired UE functional use, Increased muscle spasms, Decreased endurance, Decreased activity tolerance, Pain, Hypomobility, Impaired flexibility, Decreased mobility, Decreased strength  Visit Diagnosis: Acute pain of right shoulder  Stiffness of right shoulder, not elsewhere classified     Problem List Patient Active Problem List  Diagnosis Date Noted  . Adhesive capsulitis of right shoulder 12/02/2018  . Acute bursitis of right shoulder 09/23/2018  . Adhesive capsulitis of left shoulder 12/10/2017  . Spondylosis of cervical region without myelopathy or radiculopathy 12/10/2017  . Kidney  stones 04/01/2017  . Bilateral Achilles tendonosis 12/10/2016    Sedalia Muta, PT, DPT 2:24 PM  12/31/18    [facility] [address] [place], [place], [address] Phone: [phone]   Fax:  [phone]  Name: Timothy [name] MRN: [id number] Date of Birth: 27-Oct-1955

## 2019-01-05 ENCOUNTER — Ambulatory Visit (INDEPENDENT_AMBULATORY_CARE_PROVIDER_SITE_OTHER): Payer: BC Managed Care – PPO | Admitting: Physical Therapy

## 2019-01-05 ENCOUNTER — Other Ambulatory Visit: Payer: Self-pay

## 2019-01-05 DIAGNOSIS — M25611 Stiffness of right shoulder, not elsewhere classified: Secondary | ICD-10-CM

## 2019-01-05 DIAGNOSIS — M25511 Pain in right shoulder: Secondary | ICD-10-CM

## 2019-01-06 ENCOUNTER — Encounter: Payer: Self-pay | Admitting: Physical Therapy

## 2019-01-06 DIAGNOSIS — L570 Actinic keratosis: Secondary | ICD-10-CM | POA: Diagnosis not present

## 2019-01-06 DIAGNOSIS — D1801 Hemangioma of skin and subcutaneous tissue: Secondary | ICD-10-CM | POA: Diagnosis not present

## 2019-01-06 DIAGNOSIS — L814 Other melanin hyperpigmentation: Secondary | ICD-10-CM | POA: Diagnosis not present

## 2019-01-06 NOTE — Therapy (Signed)
[facility] [address] [place], [place], [address] Phone: [phone]   Fax:  [phone]  Physical Therapy Treatment  Patient Details  Name: Timothy [name] MRN: [id number] Date of Birth: Mar 16, 1955 Referring Provider (PT): Charlann Boxer   Encounter Date: 01/05/2019  PT End of Session - 01/06/19 0919    Visit Number  6    Number of Visits  12    Date for PT Re-Evaluation  01/28/19    Authorization Type  BCBS    PT Start Time  0850    PT Stop Time  0928    PT Time Calculation (min)  38 min    Activity Tolerance  Patient tolerated treatment well    Behavior During Therapy  [facility] for tasks assessed/performed       Past Medical History:  Diagnosis Date  . Kidney stones     Past Surgical History:  Procedure Laterality Date  . CHOLECYSTECTOMY    . HERNIA REPAIR    . KNEE SURGERY      There were no vitals filed for this visit.  Subjective Assessment - 01/06/19 0918    Subjective  PT with less soreness today from previous visit. Still states soreness at end ranges, and with reaching out/back    Currently in Pain?  Yes    Pain Score  3     Pain Location  Shoulder    Pain Orientation  Right    Pain Descriptors / Indicators  Aching    Pain Type  Acute pain    Pain Onset  More than a month ago    Pain Frequency  Intermittent         [facility] PT Assessment - 01/06/19 0001      AROM   Right Shoulder Flexion  145 Degrees    Right Shoulder ABduction  140 Degrees      PROM   Right Shoulder Flexion  165 Degrees    Right Shoulder ABduction  140 Degrees      Strength   Right Shoulder Flexion  4/5    Right Shoulder ABduction  4/5    Right Shoulder Internal Rotation  4/5    Right Shoulder External Rotation  4/5                   [facility] Adult PT Treatment/Exercise - 01/05/19 0855      Exercises   Exercises  Shoulder      Shoulder Exercises: Supine   Flexion  15 reps;AROM    Flexion Limitations  --    Other  Supine Exercises  ER/IR 2lb strength x20;     Other Supine Exercises  Horiz abd x20 2lb      Shoulder Exercises: Standing   External Rotation  20 reps    Theraband Level (Shoulder External Rotation)  Level 3 (Green)    Internal Rotation  20 reps    Theraband Level (Shoulder Internal Rotation)  Level 3 (Green)    Flexion  10 reps    ABduction  10 reps    Row  20 reps    Theraband Level (Shoulder Row)  Level 4 (Blue)    Other Standing Exercises  D1 and D2 PNF, AROM x10 each;       Shoulder Exercises: ROM/Strengthening   UBE (Upper Arm Bike)  Fluid cycle x4  min      Shoulder Exercises: Stretch   Corner Stretch  3 reps;30 seconds    Corner Stretch Limitations  doorway    Internal Rotation Stretch  5 reps    Internal Rotation Stretch Limitations  behind back, stick     Other Shoulder Stretches  Supine ER butterfly stretch 5 sec x15;     Other Shoulder Stretches  Posterior shoulder stretch 30 sec x3;       Modalities   Modalities  --      Iontophoresis   Type of Iontophoresis  --    Location  --    Time  --      Manual Therapy   Manual Therapy  Joint mobilization;Passive ROM;Soft tissue mobilization    Joint Mobilization  R GHJ inf and post mobs,     Passive ROM  R shoulder, all motions,                PT Short Term Goals - 12/31/18 1422      PT SHORT TERM GOAL #1   Title  Pt to be independent with initial HEP for shoulder ROM    Time  2    Period  Weeks    Status  Achieved    Target Date  12/31/18      PT SHORT TERM GOAL #2   Title  Pt to demo improved shoulder abd by at least 10 deg, AROM.    Time  2    Period  Weeks    Status  Achieved    Target Date  12/31/18        PT Long Term Goals - 12/17/18 0946      PT LONG TERM GOAL #1   Title  Pt to report decreased pain to 0-2/10 with R UE activity, reaching, elevation    Time  6    Period  Weeks    Status  New    Target Date  01/28/19      PT LONG TERM GOAL #2   Title  Pt to demo improved AROM of R  shoulder to be WNL to improve ability for IADLs.    Time  6    Period  Weeks    Status  New    Target Date  01/28/19      PT LONG TERM GOAL #3   Title  Pt to demo increased strength of R shoulder to at least 4+/5 to improve ability for reaching, lifting, carry, and IADLs.    Time  6    Period  Weeks    Status  New    Target Date  01/28/19      PT LONG TERM GOAL #4   Title  Pt to be independent with final HEP for SHoulder ROm and strength    Time  6    Period  Weeks    Status  New    Target Date  01/28/19            Plan - 01/06/19 0920    Clinical Impression Statement  Pt with mild stiffness at end range of flexion and ER. He has pain with end range of both motions as well, although ROM continues to improve. AROM definitely improved for all motions, including IR behind back. Pt progressing well, will benefit from continued care for full PROM and pain.    Personal Factors and Comorbidities  Comorbidity 1    Comorbidities  DM    Examination-Activity Limitations  Reach Overhead;Sleep;Carry;Hygiene/Grooming;Lift    Examination-Participation Restrictions  Cleaning;Community Activity;Driving;Yard Work    Stability/Clinical Decision Making  Stable/Uncomplicated    Rehab Potential  Good    PT Frequency  2x / week    PT Duration  6 weeks    PT Treatment/Interventions  ADLs/Self Care Home Management;Cryotherapy;Electrical Stimulation;DME Instruction;Ultrasound;Traction;Moist Heat;Iontophoresis 4mg /ml Dexamethasone;Functional mobility training;Therapeutic activities;Therapeutic exercise;Neuromuscular re-education;Manual techniques;Patient/family education;Passive range of motion;Dry needling;Taping;Spinal Manipulations;Joint Manipulations    Consulted and Agree with Plan of Care  Patient       Patient will benefit from skilled therapeutic intervention in order to improve the following deficits and impairments:  Decreased range of motion, Impaired UE functional use, Increased muscle  spasms, Decreased endurance, Decreased activity tolerance, Pain, Hypomobility, Impaired flexibility, Decreased mobility, Decreased strength  Visit Diagnosis: Acute pain of right shoulder  Stiffness of right shoulder, not elsewhere classified     Problem List Patient Active Problem List   Diagnosis Date Noted  . Adhesive capsulitis of right shoulder 12/02/2018  . Acute bursitis of right shoulder 09/23/2018  . Adhesive capsulitis of left shoulder 12/10/2017  . Spondylosis of cervical region without myelopathy or radiculopathy 12/10/2017  . Kidney stones 04/01/2017  . Bilateral Achilles tendonosis 12/10/2016    Sedalia MutaLauren Dareion Kneece, PT, DPT 9:25 AM  01/06/19    [facility] [address] [address] [place], [place], [address] Phone: [phone]   Fax:  [phone]  Name: Timothy [name] MRN: [id number] Date of Birth: 11/17/1955

## 2019-01-07 ENCOUNTER — Encounter: Payer: Self-pay | Admitting: Family Medicine

## 2019-01-07 ENCOUNTER — Other Ambulatory Visit: Payer: Self-pay

## 2019-01-07 ENCOUNTER — Ambulatory Visit (INDEPENDENT_AMBULATORY_CARE_PROVIDER_SITE_OTHER): Payer: BC Managed Care – PPO | Admitting: Family Medicine

## 2019-01-07 ENCOUNTER — Ambulatory Visit: Payer: Self-pay

## 2019-01-07 VITALS — BP 144/84 | HR 66 | Ht 70.0 in | Wt 221.0 lb

## 2019-01-07 DIAGNOSIS — G8929 Other chronic pain: Secondary | ICD-10-CM

## 2019-01-07 DIAGNOSIS — M7551 Bursitis of right shoulder: Secondary | ICD-10-CM

## 2019-01-07 DIAGNOSIS — M25511 Pain in right shoulder: Secondary | ICD-10-CM

## 2019-01-07 NOTE — Patient Instructions (Signed)
Good to see you Keep doing the exercises Finish up for Lauren Sorry for your loss See me again in 2 months

## 2019-01-07 NOTE — Progress Notes (Signed)
[facility] [address] [place], [place] [address] Phone: [phone] Subjective:   I Timothy [name] am serving as a Neurosurgeon for Dr. Antoine Primas.  This visit occurred during the SARS-CoV-2 public health emergency.  Safety protocols were in place, including screening questions prior to the visit, additional usage of staff PPE, and extensive cleaning of exam room while observing appropriate contact time as indicated for disinfecting solutions.     CC: Right shoulder pain  OVF:[id number]   12/02/2018 Started having decreasing range of motion.  Likely contributing to the contralateral side and the pain the patient is having.  We discussed icing regimen, home exercise, vitamin D supplementation, would like to hold 4 to 6 weeks but if having worsening pain consider formal physical therapy and injection.  01/07/2019 Timothy [name] is a 63 y.o. male coming in with complaint of right shoulder pain. Patient states he is doing better. Going to PT and working on restoring his ROM. Experiencing some pain at times. Patient was seen approximately 70% better.  Still with patient's terminal range of motion notices some discomfort and pain.  States is still some uncomfortableness at night but is improving.  Feels physical therapy has been significantly beneficial.     Past Medical History:  Diagnosis Date  . Kidney stones    Past Surgical History:  Procedure Laterality Date  . CHOLECYSTECTOMY    . HERNIA REPAIR    . KNEE SURGERY     Social History   Socioeconomic History  . Marital status: Married    Spouse name: Not on file  . Number of children: Not on file  . Years of education: Not on file  . Highest education level: Not on file  Occupational History  . Not on file  Social Needs  . Financial resource strain: Not on file  . Food insecurity    Worry: Not on file    Inability: Not on file  . Transportation needs    Medical: Not on file    Non-medical: Not  on file  Tobacco Use  . Smoking status: Never Smoker  . Smokeless tobacco: Never Used  Substance and Sexual Activity  . Alcohol use: Yes    Comment: occ  . Drug use: No  . Sexual activity: Not on file  Lifestyle  . Physical activity    Days per week: Not on file    Minutes per session: Not on file  . Stress: Not on file  Relationships  . Social Musician on phone: Not on file    Gets together: Not on file    Attends religious service: Not on file    Active member of club or organization: Not on file    Attends meetings of clubs or organizations: Not on file    Relationship status: Not on file  Other Topics Concern  . Not on file  Social History Narrative  . Not on file   No Known Allergies No family history on file.  Current Outpatient Medications (Endocrine & Metabolic):  .  metFORMIN (GLUCOPHAGE) 500 MG tablet, Take 500 mg 1 day or 1 dose by mouth.    Current Outpatient Medications (Analgesics):  .  meloxicam (MOBIC) 15 MG tablet, Take 1 tablet (15 mg total) by mouth daily.   Current Outpatient Medications (Other):  Marland Kitchen  Vitamin D, Ergocalciferol, (DRISDOL) 1.25 MG (50000 UT) CAPS capsule, Take 1 capsule (50,000 Units total) by mouth every 7 (seven) days.  Past medical history, social, surgical and family history all reviewed in electronic medical record.  No pertanent information unless stated regarding to the chief complaint.   Review of Systems:  No headache, visual changes, nausea, vomiting, diarrhea, constipation, dizziness, abdominal pain, skin rash, fevers, chills, night sweats, weight loss, swollen lymph nodes, body aches, joint swelling, muscle aches, chest pain, shortness of breath, mood changes.   Objective  Blood pressure (!) 144/84, pulse 66, height 5\' 10"  (1.778 m), weight 221 lb (100.2 kg), SpO2 97 %.    General: No apparent distress alert and oriented x3 mood and affect normal, dressed appropriately.  HEENT: Pupils equal, extraocular  movements intact  Respiratory: Patient's speak in full sentences and does not appear short of breath  Cardiovascular: No lower extremity edema, non tender, no erythema  Skin: Warm dry intact with no signs of infection or rash on extremities or on axial skeleton.  Abdomen: Soft nontender  Neuro: Cranial nerves II through XII are intact, neurovascularly intact in all extremities with 2+ DTRs and 2+ pulses.  Lymph: No lymphadenopathy of posterior or anterior cervical chain or axillae bilaterally.  Gait normal with good balance and coordination.  MSK:  Non tender with full range of motion and good stability and symmetric strength and tone of  elbows, wrist, hip, knee and ankles bilaterally.   Shoulder: Right Inspection reveals no abnormalities, atrophy or asymmetry. Palpation is normal with no tenderness over AC joint or bicipital groove. ROM is full in all planes passively. Rotator cuff strength normal throughout. signs of impingement with positive Neer and Hawkin's tests, but negative empty can sign. Speeds and Yergason's tests normal. No labral pathology noted with negative Obrien's, negative clunk and good stability. Normal scapular function observed. No painful arc and no drop arm sign. No apprehension sign    Procedure: Real-time Ultrasound Guided Injection of right glenohumeral joint Device: GE Logiq E  Ultrasound guided injection is preferred based studies that show increased duration, increased effect, greater accuracy, decreased procedural pain, increased response rate with ultrasound guided versus blind injection.  Verbal informed consent obtained.  Time-out conducted.  Noted no overlying erythema, induration, or other signs of local infection.  Skin prepped in a sterile fashion.  Local anesthesia: Topical Ethyl chloride.  With sterile technique and under real time ultrasound guidance:  Joint visualized.  23g 1  inch needle inserted posterior approach. Pictures taken for  needle placement. Patient did have injection of 2 cc of 1% lidocaine, 2 cc of 0.5% Marcaine, and 1.0 cc of Kenalog 40 mg/dL. Completed without difficulty  Pain immediately resolved suggesting accurate placement of the medication.  Advised to call if fevers/chills, erythema, induration, drainage, or persistent bleeding.  Images permanently stored and available for review in the ultrasound unit.  Impression: Technically successful ultrasound guided injection.   Impression and Recommendations:     This case required medical decision making of moderate complexity. The above documentation has been reviewed and is accurate and complete Lyndal Pulley, DO       Note: This dictation was prepared with Dragon dictation along with smaller phrase technology. Any transcriptional errors that result from this process are unintentional.

## 2019-01-07 NOTE — Assessment & Plan Note (Signed)
Repeat injection given today.  Patient has been continue to work on range of motion exercises.  We discussed posture and ergonomics, discussed which activities to do which wants to avoid.  Patient will follow up with me again in 4 to 8 weeks.

## 2019-01-08 ENCOUNTER — Encounter: Payer: Self-pay | Admitting: Physical Therapy

## 2019-01-08 ENCOUNTER — Ambulatory Visit (INDEPENDENT_AMBULATORY_CARE_PROVIDER_SITE_OTHER): Payer: BC Managed Care – PPO | Admitting: Physical Therapy

## 2019-01-08 DIAGNOSIS — M25511 Pain in right shoulder: Secondary | ICD-10-CM

## 2019-01-08 DIAGNOSIS — M25611 Stiffness of right shoulder, not elsewhere classified: Secondary | ICD-10-CM | POA: Diagnosis not present

## 2019-01-08 NOTE — Therapy (Signed)
[facility] [address] [place], [place], [address] Phone: [phone]   Fax:  [phone]  Physical Therapy Treatment  Patient Details  Name: Timothy [name] MRN: [id number] Date of Birth: 04-Jun-1955 Referring Provider (PT): Terrilee Files   Encounter Date: 01/08/2019  PT End of Session - 01/08/19 0955    Visit Number  7    Number of Visits  12    Date for PT Re-Evaluation  01/28/19    Authorization Type  BCBS    PT Start Time  0848    PT Stop Time  0927    PT Time Calculation (min)  39 min    Activity Tolerance  Patient tolerated treatment well    Behavior During Therapy  [facility] for tasks assessed/performed       Past Medical History:  Diagnosis Date  . Kidney stones     Past Surgical History:  Procedure Laterality Date  . CHOLECYSTECTOMY    . HERNIA REPAIR    . KNEE SURGERY      There were no vitals filed for this visit.  Subjective Assessment - 01/08/19 0954    Subjective  Pt had MD visit yesterday, had injection. States minimal soreness from injection today.    Currently in Pain?  Yes    Pain Score  2     Pain Location  Shoulder    Pain Orientation  Right    Pain Descriptors / Indicators  Aching    Pain Type  Acute pain    Pain Onset  More than a month ago    Pain Frequency  Intermittent                       [facility] Adult PT Treatment/Exercise - 01/08/19 0904      Exercises   Exercises  Shoulder      Shoulder Exercises: Supine   Flexion  AAROM;15 reps    Flexion Limitations  cane    Other Supine Exercises  ER/IR 3 lb strength x20;     Other Supine Exercises  --      Shoulder Exercises: Standing   External Rotation  20 reps    Theraband Level (Shoulder External Rotation)  Level 3 (Green)    Internal Rotation  20 reps    Theraband Level (Shoulder Internal Rotation)  Level 3 (Green)    Flexion  10 reps    ABduction  10 reps    Row  20 reps    Theraband Level (Shoulder Row)  Level 4 (Blue)    Other Standing Exercises  --      Shoulder Exercises: ROM/Strengthening   UBE (Upper Arm Bike)  Fluid cycle x4  min      Shoulder Exercises: Stretch   Corner Stretch  3 reps;30 seconds    Corner Stretch Limitations  doorway    Internal Rotation Stretch  5 reps    Internal Rotation Stretch Limitations  behind back, stick     Other Shoulder Stretches  Supine ER butterfly stretch 5 sec x15;     Other Shoulder Stretches  --      Manual Therapy   Manual Therapy  Joint mobilization;Passive ROM;Soft tissue mobilization    Joint Mobilization  R GHJ inf and post mobs,     Passive ROM  R shoulder, all motions,              PT Education - 01/08/19 0955    Education Details  HEP  updated    Person(s) Educated  Patient    Methods  Explanation;Demonstration;Handout    Comprehension  Verbalized understanding;Returned demonstration;Verbal cues required       PT Short Term Goals - 12/31/18 1422      PT SHORT TERM GOAL #1   Title  Pt to be independent with initial HEP for shoulder ROM    Time  2    Period  Weeks    Status  Achieved    Target Date  12/31/18      PT SHORT TERM GOAL #2   Title  Pt to demo improved shoulder abd by at least 10 deg, AROM.    Time  2    Period  Weeks    Status  Achieved    Target Date  12/31/18        PT Long Term Goals - 12/17/18 0946      PT LONG TERM GOAL #1   Title  Pt to report decreased pain to 0-2/10 with R UE activity, reaching, elevation    Time  6    Period  Weeks    Status  New    Target Date  01/28/19      PT LONG TERM GOAL #2   Title  Pt to demo improved AROM of R shoulder to be WNL to improve ability for IADLs.    Time  6    Period  Weeks    Status  New    Target Date  01/28/19      PT LONG TERM GOAL #3   Title  Pt to demo increased strength of R shoulder to at least 4+/5 to improve ability for reaching, lifting, carry, and IADLs.    Time  6    Period  Weeks    Status  New    Target Date  01/28/19      PT LONG TERM GOAL  #4   Title  Pt to be independent with final HEP for SHoulder ROm and strength    Time  6    Period  Weeks    Status  New    Target Date  01/28/19            Plan - 01/08/19 0957    Clinical Impression Statement  Pt making good progress. Improved IR behind back today. Pt will be seen at decreased frequency in next 2 weeks, plan to work towards d/c. HEP updated/reviewed today. Pt still with end range pain for flexion and ER, will continue to assess effects of injection next week.    Personal Factors and Comorbidities  Comorbidity 1    Comorbidities  DM    Examination-Activity Limitations  Reach Overhead;Sleep;Carry;Hygiene/Grooming;Lift    Examination-Participation Restrictions  Cleaning;Community Activity;Driving;Yard Work    Stability/Clinical Decision Making  Stable/Uncomplicated    Rehab Potential  Good    PT Frequency  2x / week    PT Duration  6 weeks    PT Treatment/Interventions  ADLs/Self Care Home Management;Cryotherapy;Electrical Stimulation;DME Instruction;Ultrasound;Traction;Moist Heat;Iontophoresis 4mg /ml Dexamethasone;Functional mobility training;Therapeutic activities;Therapeutic exercise;Neuromuscular re-education;Manual techniques;Patient/family education;Passive range of motion;Dry needling;Taping;Spinal Manipulations;Joint Manipulations    Consulted and Agree with Plan of Care  Patient       Patient will benefit from skilled therapeutic intervention in order to improve the following deficits and impairments:  Decreased range of motion, Impaired UE functional use, Increased muscle spasms, Decreased endurance, Decreased activity tolerance, Pain, Hypomobility, Impaired flexibility, Decreased mobility, Decreased strength  Visit Diagnosis: Acute pain of right shoulder  Stiffness of right shoulder, not elsewhere classified     Problem List Patient Active Problem List   Diagnosis Date Noted  . Adhesive capsulitis of right shoulder 12/02/2018  . Acute bursitis of  right shoulder 09/23/2018  . Adhesive capsulitis of left shoulder 12/10/2017  . Spondylosis of cervical region without myelopathy or radiculopathy 12/10/2017  . Kidney stones 04/01/2017  . Bilateral Achilles tendonosis 12/10/2016    Sedalia MutaLauren Jaquia Benedicto, PT, DPT 10:01 AM  01/08/19    [facility] [address] [address] [place], [place], [address] Phone: [phone]   Fax:  [phone]  Name: Riccardo DubinRusty W Knezevic MRN: [id number] Date of Birth: 07/20/1955

## 2019-01-12 ENCOUNTER — Encounter: Payer: BC Managed Care – PPO | Admitting: Physical Therapy

## 2019-01-13 DIAGNOSIS — Z13220 Encounter for screening for lipoid disorders: Secondary | ICD-10-CM | POA: Diagnosis not present

## 2019-01-13 DIAGNOSIS — Z23 Encounter for immunization: Secondary | ICD-10-CM | POA: Diagnosis not present

## 2019-01-13 DIAGNOSIS — E119 Type 2 diabetes mellitus without complications: Secondary | ICD-10-CM | POA: Diagnosis not present

## 2019-01-13 DIAGNOSIS — R072 Precordial pain: Secondary | ICD-10-CM | POA: Diagnosis not present

## 2019-01-14 ENCOUNTER — Ambulatory Visit (INDEPENDENT_AMBULATORY_CARE_PROVIDER_SITE_OTHER): Payer: BC Managed Care – PPO | Admitting: Physical Therapy

## 2019-01-14 ENCOUNTER — Encounter: Payer: Self-pay | Admitting: Physical Therapy

## 2019-01-14 ENCOUNTER — Other Ambulatory Visit: Payer: Self-pay

## 2019-01-14 DIAGNOSIS — M25611 Stiffness of right shoulder, not elsewhere classified: Secondary | ICD-10-CM

## 2019-01-14 DIAGNOSIS — M25511 Pain in right shoulder: Secondary | ICD-10-CM | POA: Diagnosis not present

## 2019-01-14 NOTE — Patient Instructions (Signed)
Access Code: [email or web address]  URL: [email or web address]  Date: 01/08/2019  Prepared by: Lyndee Hensen   Exercises Supine Shoulder Flexion with Dowel - 10 reps - 1 sets - 3x daily Supine Shoulder External Rotation in 45 Degrees Abduction AAROM with Dowel - 10 reps - 1 sets - 3x daily Supine Shoulder External Rotation with Dowel - 10 reps - 1 sets - 3x daily Standing Shoulder Internal Rotation Stretch with Hands Behind Back - 5 reps - 5 hold - 3x daily Supine Chest Stretch with Elbows Bent - 5 reps - 30 hold Doorway Pec Stretch at 90 Degrees Abduction - 3 reps - 30 hold - 2x daily Scapular Retraction with Resistance - 10 reps - 2 sets - 1x daily Shoulder Internal Rotation with Resistance - 10 reps - 2 sets - 1x daily Shoulder External Rotation with Anchored Resistance - 10 reps - 2 sets - 1x daily Supine Single Arm Shoulder External Rotation AROM - 10 reps - 2 sets - 1x daily Standing Shoulder Scaption - 10 reps - 1 sets - 1x daily

## 2019-01-14 NOTE — Therapy (Signed)
[facility] [address] [place], [place], [address] Phone: [phone]   Fax:  [phone]  Physical Therapy Treatment/Discharge   Patient Details  Name: Timothy [name] MRN: [id number] Date of Birth: 1955-04-21 Referring Provider (PT): Charlann Boxer   Encounter Date: 01/14/2019  PT End of Session - 01/14/19 0857    Visit Number  8    Number of Visits  12    Date for PT Re-Evaluation  01/28/19    Authorization Type  BCBS    PT Start Time  0850    PT Stop Time  0922    PT Time Calculation (min)  32 min    Activity Tolerance  Patient tolerated treatment well    Behavior During Therapy  [facility] for tasks assessed/performed       Past Medical History:  Diagnosis Date  . Kidney stones     Past Surgical History:  Procedure Laterality Date  . CHOLECYSTECTOMY    . HERNIA REPAIR    . KNEE SURGERY      There were no vitals filed for this visit.  Subjective Assessment - 01/14/19 0855    Subjective  Pt states shoulder has been less painful since injection.    Currently in Pain?  No/denies         [facility] PT Assessment - 01/14/19 0001      AROM   Overall AROM Comments  WNL, all motions       Strength   Overall Strength Comments  5/5 all motions                    [facility] Adult PT Treatment/Exercise - 01/14/19 0001      Shoulder Exercises: Supine   Flexion  AAROM;15 reps    Shoulder Flexion Weight (lbs)  cane 1.5 lb    Other Supine Exercises  ER/IR 3 lb strength x20;       Shoulder Exercises: Standing   External Rotation  20 reps    Theraband Level (Shoulder External Rotation)  Level 3 (Green)    Internal Rotation  20 reps    Theraband Level (Shoulder Internal Rotation)  Level 3 (Green)    Row  20 reps    Theraband Level (Shoulder Row)  Level 4 (Blue)    Other Standing Exercises  Wall push ups x20;       Shoulder Exercises: ROM/Strengthening   UBE (Upper Arm Bike)  Fluid cycle x4  min      Shoulder Exercises:  Stretch   Internal Rotation Stretch Limitations  x10 stick behind back    Other Shoulder Stretches  Supine ER butterfly stretch 5 sec x15;       Manual Therapy   Joint Mobilization  R GHJ inf and post mobs,     Passive ROM  R shoulder, all motions,              PT Education - 01/14/19 0857    Education Details  D/C plan discussed., final HEP reviewed    Person(s) Educated  Patient    Methods  Explanation    Comprehension  Verbalized understanding       PT Short Term Goals - 12/31/18 1422      PT SHORT TERM GOAL #1   Title  Pt to be independent with initial HEP for shoulder ROM    Time  2    Period  Weeks    Status  Achieved    Target Date  12/31/18  PT SHORT TERM GOAL #2   Title  Pt to demo improved shoulder abd by at least 10 deg, AROM.    Time  2    Period  Weeks    Status  Achieved    Target Date  12/31/18        PT Long Term Goals - 01/14/19 0924      PT LONG TERM GOAL #1   Title  Pt to report decreased pain to 0-2/10 with R UE activity, reaching, elevation    Time  6    Period  Weeks    Status  Achieved      PT LONG TERM GOAL #2   Title  Pt to demo improved AROM of R shoulder to be WNL to improve ability for IADLs.    Time  6    Period  Weeks    Status  Achieved      PT LONG TERM GOAL #3   Title  Pt to demo increased strength of R shoulder to at least 4+/5 to improve ability for reaching, lifting, carry, and IADLs.    Time  6    Period  Weeks    Status  Achieved      PT LONG TERM GOAL #4   Title  Pt to be independent with final HEP for SHoulder ROm and strength    Time  6    Period  Weeks    Status  Achieved            Plan - 01/14/19 7672    Clinical Impression Statement  Pt with good progress since last injection. No pain today. Much improved ROM overall. Pt states no pain today, has  mild discomfort with end range Flexion and ER with over pressure. Has met all goals at this time. Final HEP reviewed. Pt ready for d/c to HEP.     Personal Factors and Comorbidities  Comorbidity 1    Comorbidities  DM    Examination-Activity Limitations  Reach Overhead;Sleep;Carry;Hygiene/Grooming;Lift    Examination-Participation Restrictions  Cleaning;Community Activity;Driving;Yard Work    Stability/Clinical Decision Making  Stable/Uncomplicated    Rehab Potential  Good    PT Frequency  2x / week    PT Duration  6 weeks    PT Treatment/Interventions  ADLs/Self Care Home Management;Cryotherapy;Electrical Stimulation;DME Instruction;Ultrasound;Traction;Moist Heat;Iontophoresis '4mg'$ /ml Dexamethasone;Functional mobility training;Therapeutic activities;Therapeutic exercise;Neuromuscular re-education;Manual techniques;Patient/family education;Passive range of motion;Dry needling;Taping;Spinal Manipulations;Joint Manipulations    Consulted and Agree with Plan of Care  Patient       Patient will benefit from skilled therapeutic intervention in order to improve the following deficits and impairments:  Decreased range of motion, Impaired UE functional use, Increased muscle spasms, Decreased endurance, Decreased activity tolerance, Pain, Hypomobility, Impaired flexibility, Decreased mobility, Decreased strength  Visit Diagnosis: Acute pain of right shoulder  Stiffness of right shoulder, not elsewhere classified     Problem List Patient Active Problem List   Diagnosis Date Noted  . Adhesive capsulitis of right shoulder 12/02/2018  . Acute bursitis of right shoulder 09/23/2018  . Adhesive capsulitis of left shoulder 12/10/2017  . Spondylosis of cervical region without myelopathy or radiculopathy 12/10/2017  . Kidney stones 04/01/2017  . Bilateral Achilles tendonosis 12/10/2016    Lyndee Hensen, PT, DPT 9:29 AM  01/14/19    [facility] [place], [place], [address] Phone: [phone]   Fax:  [phone]  Name: Timothy [name] MRN: [id number] Date of Birth: 23-Aug-1955  PHYSICAL THERAPY DISCHARGE SUMMARY  Visits from Start of Care: 8  Plan: Patient agrees to discharge.  Patient goals were met. Patient is being discharged due to meeting the stated rehab goals.  ?????    Lyndee Hensen, PT, DPT 9:30 AM  01/14/19

## 2019-01-19 ENCOUNTER — Encounter: Payer: BC Managed Care – PPO | Admitting: Physical Therapy

## 2019-01-26 ENCOUNTER — Other Ambulatory Visit: Payer: Self-pay | Admitting: Family Medicine

## 2019-02-17 ENCOUNTER — Other Ambulatory Visit: Payer: Self-pay | Admitting: Family Medicine

## 2019-03-11 ENCOUNTER — Encounter: Payer: Self-pay | Admitting: Family Medicine

## 2019-03-11 ENCOUNTER — Ambulatory Visit (INDEPENDENT_AMBULATORY_CARE_PROVIDER_SITE_OTHER): Payer: BC Managed Care – PPO | Admitting: Family Medicine

## 2019-03-11 ENCOUNTER — Other Ambulatory Visit: Payer: Self-pay

## 2019-03-11 DIAGNOSIS — M7551 Bursitis of right shoulder: Secondary | ICD-10-CM | POA: Diagnosis not present

## 2019-03-11 NOTE — Assessment & Plan Note (Signed)
Patient is 80% better at this time.  Sugar continues to improve significantly.  Patient did have still some mild difficulty with the range of motion we may take a little more time.  Continue the vitamin D.  Follow-up with me again on an as-needed basis.

## 2019-03-11 NOTE — Patient Instructions (Signed)
Good to see you see me when you need me

## 2019-03-11 NOTE — Progress Notes (Signed)
[employer] [place] [facility] Phone: [phone] Subjective:   Timothy [name], am serving as a scribe for Dr. Hulan [name]. This visit occurred during the SARS-CoV-2 public health emergency.  Safety protocols were in place, including screening questions prior to the visit, additional usage of staff PPE, and extensive cleaning of exam room while observing appropriate contact time as indicated for disinfecting solutions.   I'm seeing this patient by the request  of:  [name], Timothy A, MD  CC: Right shoulder follow-up  GGY:[id number]   01/07/2019 Repeat injection given today.  Patient has been continue to work on range of motion exercises.  We discussed posture and ergonomics, discussed which activities to do which wants to avoid.  Patient will follow up with me again in 4 to 8 weeks.  Update 03/11/2019 Timothy [name] is a 64 y.o. male coming in with complaint of right shoulder bursitis. Patient states that the injection did help alleviate his pain.  Patient would state 85% better at the moment.  Pain is significantly improved, continue to work on the range of motion is making progress      Past Medical History:  Diagnosis Date  . Kidney stones    Past Surgical History:  Procedure Laterality Date  . CHOLECYSTECTOMY    . HERNIA REPAIR    . KNEE SURGERY     Social History   Socioeconomic History  . Marital status: Married    Spouse name: Not on file  . Number of children: Not on file  . Years of education: Not on file  . Highest education level: Not on file  Occupational History  . Not on file  Tobacco Use  . Smoking status: Never Smoker  . Smokeless tobacco: Never Used  Substance and Sexual Activity  . Alcohol use: Yes    Comment: occ  . Drug use: [name]  . Sexual activity: Not on file  Other Topics Concern  . Not on file  Social History Narrative  . Not on file   Social Determinants of Health   Financial Resource  Strain:   . Difficulty of Paying Living Expenses: Not on file  Food Insecurity:   . Worried About Charity fundraiser in the Last Year: Not on file  . Ran Out of Food in the Last Year: Not on file  Transportation Needs:   . Lack of Transportation (Medical): Not on file  . Lack of Transportation (Non-Medical): Not on file  Physical Activity:   . Days of Exercise per Week: Not on file  . Minutes of Exercise per Session: Not on file  Stress:   . Feeling of Stress : Not on file  Social Connections:   . Frequency of Communication with Friends and Family: Not on file  . Frequency of Social Gatherings with Friends and Family: Not on file  . Attends Religious Services: Not on file  . Active Member of Clubs or Organizations: Not on file  . Attends Archivist Meetings: Not on file  . Marital Status: Not on file   [name] Known Allergies [name] known drug allergies [name] family history on file.  [name] family history of autoimmune  Current Outpatient Medications (Endocrine & Metabolic):  .  metFORMIN (GLUCOPHAGE) 500 MG tablet, Take 500 mg 1 day or 1 dose by mouth.    Current Outpatient Medications (Analgesics):  .  meloxicam (MOBIC) 15 MG tablet, TAKE 1 TABLET BY MOUTH EVERY DAY   Current  Outpatient Medications (Other):  Marland Kitchen  Vitamin D, Ergocalciferol, (DRISDOL) 1.25 MG (50000 UNIT) CAPS capsule, TAKE 1 CAPSULE (50,000 UNITS TOTAL) BY MOUTH EVERY 7 (SEVEN) DAYS.   Reviewed prior external information including notes and imaging from  primary care provider As well as notes that were available from care everywhere and other healthcare systems.  Past medical history, social, surgical and family history all reviewed in electronic medical record.  [name] pertanent information unless stated regarding to the chief complaint.   Review of Systems:  [name] headache, visual changes, nausea, vomiting, diarrhea, constipation, dizziness, abdominal pain, skin rash, fevers, chills, night sweats, weight loss,  swollen lymph nodes, body aches, joint swelling, chest pain, shortness of breath, mood changes. POSITIVE muscle aches  Objective  Blood pressure 110/74, pulse 77, height 5\' 10"  (1.778 m), weight 223 lb (101.2 kg), SpO2 98 %.   General: [name] apparent distress alert and oriented x3 mood and affect normal, dressed appropriately.  HEENT: Pupils equal, extraocular movements intact  Respiratory: Patient's speak in full sentences and does not appear short of breath  Cardiovascular: [name] lower extremity edema, non tender, [name] erythema  Skin: Warm dry intact with [name] signs of infection or rash on extremities or on axial skeleton.  Abdomen: Soft nontender  Neuro: Cranial nerves II through XII are intact, neurovascularly intact in all extremities with 2+ DTRs and 2+ pulses.  Lymph: [name] lymphadenopathy of posterior or anterior cervical chain or axillae bilaterally.  Gait normal with good balance and coordination.  MSK:  Non tender with full range of motion and good stability and symmetric strength and tone of  elbows, wrist, hip, knee and ankles bilaterally.  Right shoulder exam has improvement in range of motion, 5 out of 5 strength of the rotator cuff noted.  Patient still has some mild limitation with internal and external range of motion.  Good grip strength.   Impression and Recommendations:     . The above documentation has been reviewed and is accurate and complete , DO       Note: This dictation was prepared with Dragon dictation along with smaller phrase technology. Any transcriptional errors that result from this process are unintentional.

## 2019-04-09 ENCOUNTER — Other Ambulatory Visit: Payer: Self-pay | Admitting: Family Medicine

## 2019-05-03 ENCOUNTER — Other Ambulatory Visit: Payer: Self-pay | Admitting: Family Medicine

## 2019-05-08 ENCOUNTER — Other Ambulatory Visit: Payer: Self-pay | Admitting: Family Medicine

## 2019-05-29 ENCOUNTER — Other Ambulatory Visit: Payer: Self-pay | Admitting: Family Medicine

## 2020-02-29 ENCOUNTER — Other Ambulatory Visit: Payer: Self-pay | Admitting: Otolaryngology

## 2020-02-29 DIAGNOSIS — H918X9 Other specified hearing loss, unspecified ear: Secondary | ICD-10-CM

## 2020-03-21 ENCOUNTER — Other Ambulatory Visit: Payer: Self-pay

## 2020-03-21 ENCOUNTER — Ambulatory Visit
Admission: RE | Admit: 2020-03-21 | Discharge: 2020-03-21 | Disposition: A | Discharge status: Home / Self Care | Payer: 59 | Source: Ambulatory Visit | Attending: Otolaryngology | Admitting: Otolaryngology

## 2020-03-21 DIAGNOSIS — H918X9 Other specified hearing loss, unspecified ear: Secondary | ICD-10-CM

## 2020-03-21 MED ORDER — GADOBENATE DIMEGLUMINE 529 MG/ML IV SOLN
20.0000 mL | Freq: Once | INTRAVENOUS | Status: AC | PRN
  Administered 2020-03-21: 20 mL via INTRAVENOUS

## 2021-01-20 IMAGING — DX DG SHOULDER 2+V*R*
3 series · 3 of 3 positions shown · non-contrast
Comparison: None.

CLINICAL DATA: Right shoulder pain

EXAM:
RIGHT SHOULDER - 2+ VIEW

[grashey]
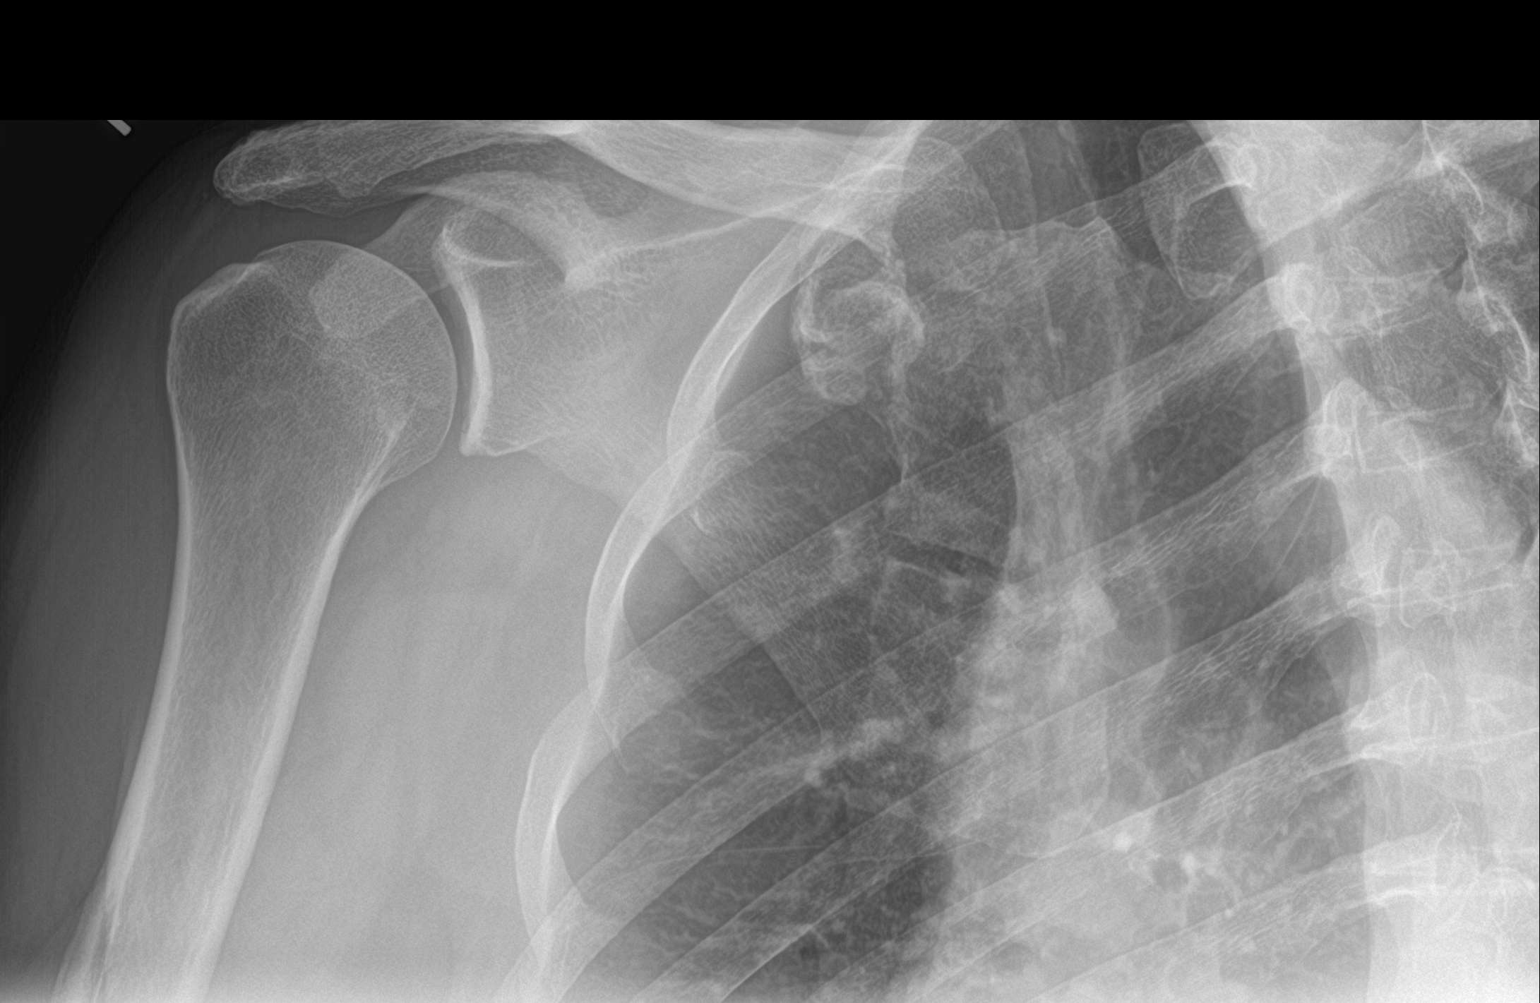

[y view]
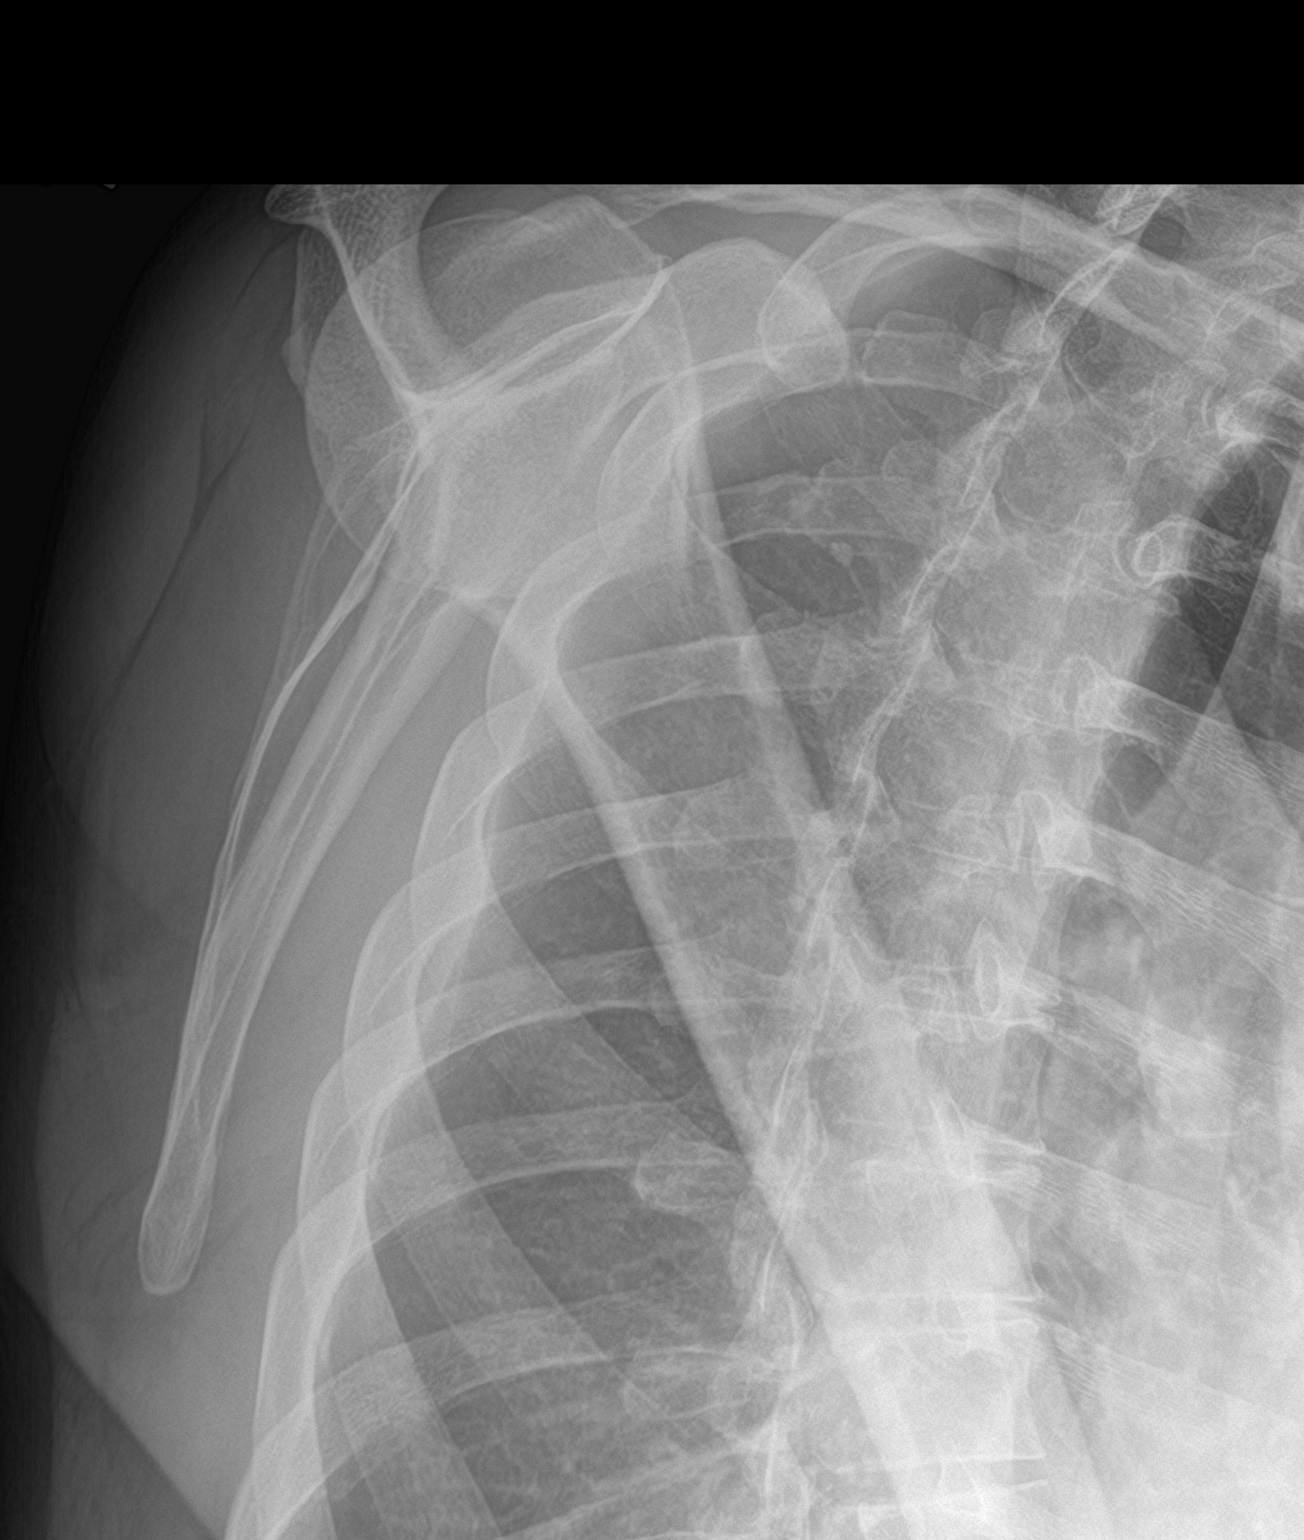

[shoulder axial]
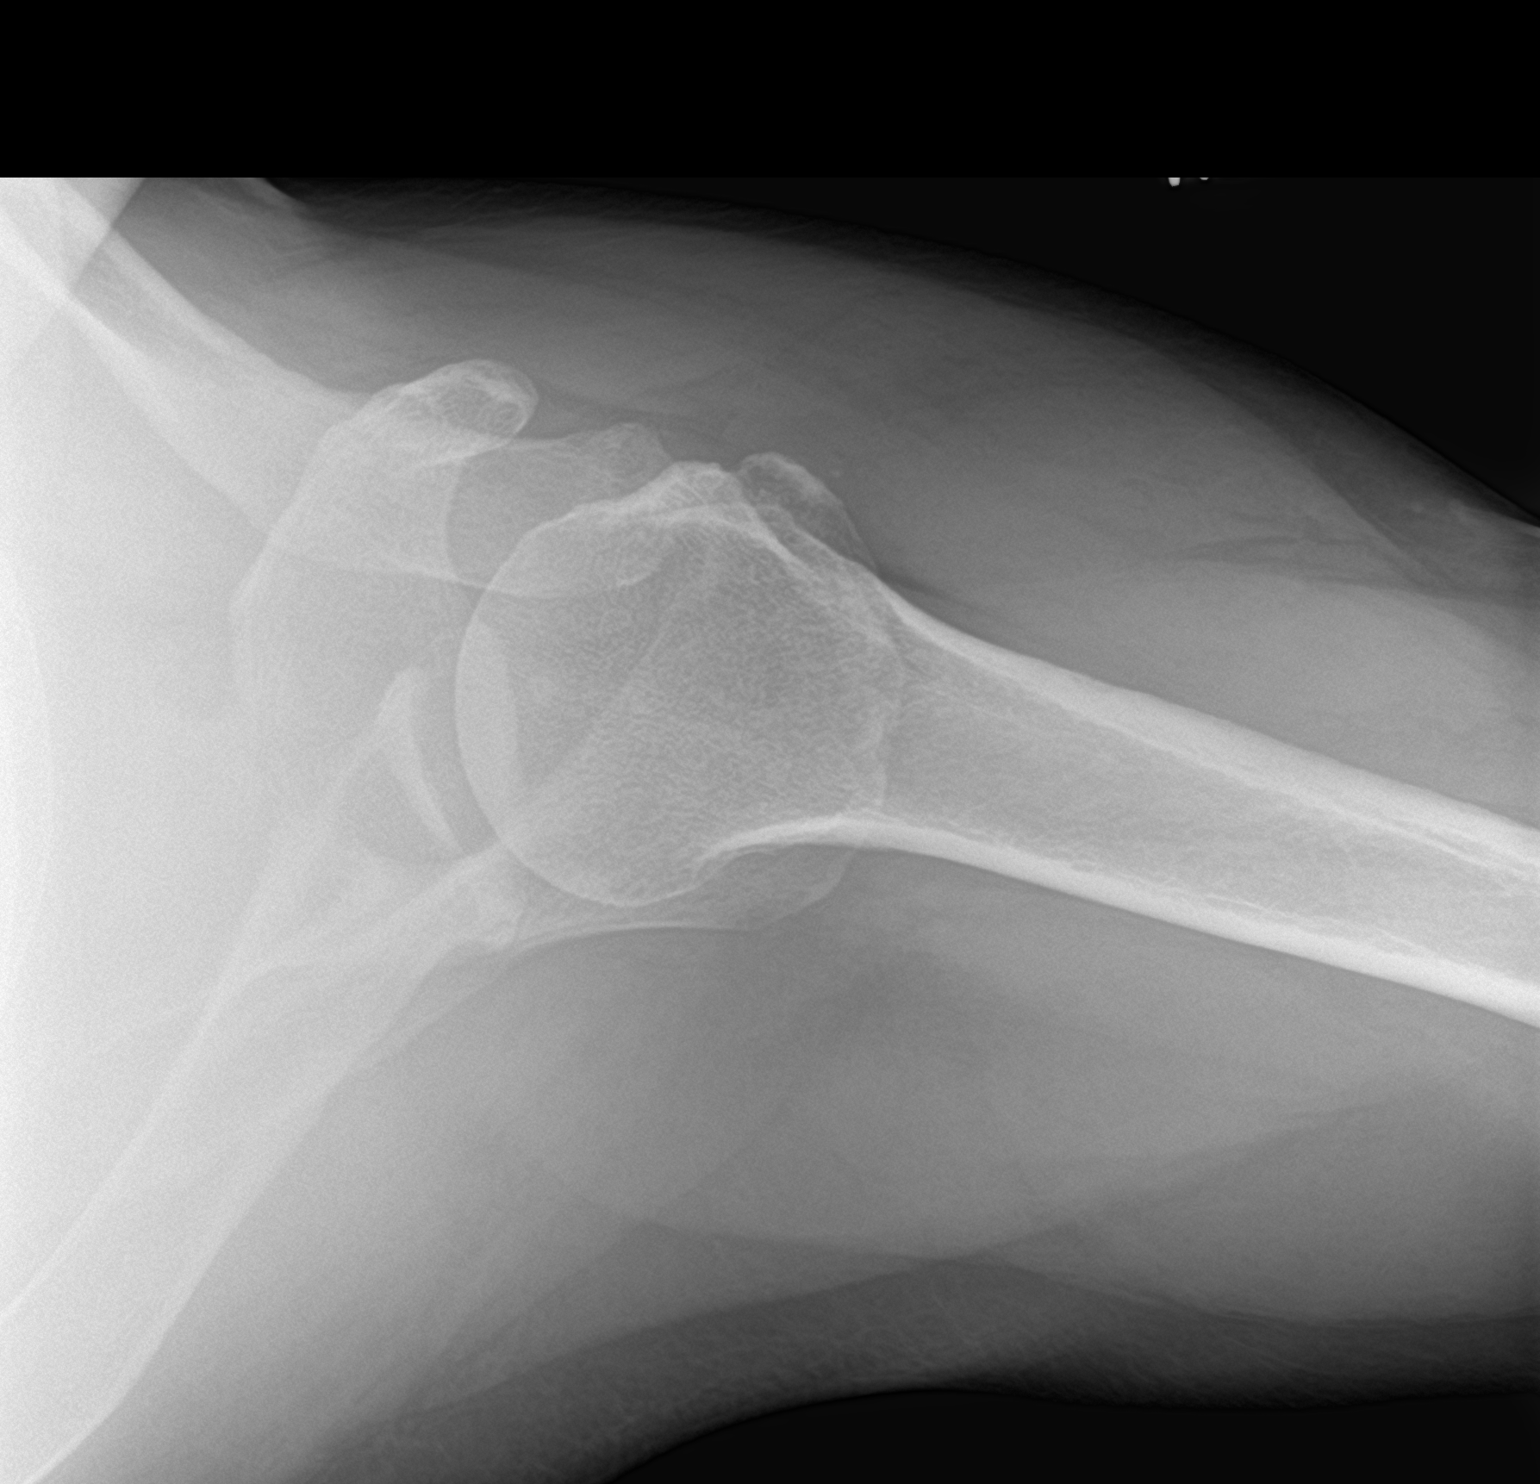

[3 of 3 positions shown; findings below may reference images not displayed]

FINDINGS: There is no evidence of fracture or dislocation. There is no
evidence of arthropathy or other focal bone abnormality. Soft
tissues are unremarkable.
IMPRESSION: Negative.

## 2022-02-08 ENCOUNTER — Other Ambulatory Visit: Payer: Self-pay | Admitting: Urology

## 2022-02-09 ENCOUNTER — Encounter (HOSPITAL_BASED_OUTPATIENT_CLINIC_OR_DEPARTMENT_OTHER): Payer: Self-pay | Admitting: Urology

## 2022-02-09 NOTE — Progress Notes (Signed)
Talked with patient. Hx and meds reviewed. NPO after MN clear liquids until 0315.  Instructions given. Arrival time 54. Wife is the driver. Bring in blue folder. OK to take AM meds if needed with a sip of water

## 2022-02-12 ENCOUNTER — Ambulatory Visit (HOSPITAL_BASED_OUTPATIENT_CLINIC_OR_DEPARTMENT_OTHER)
Admission: RE | Admit: 2022-02-12 | Discharge: 2022-02-12 | Disposition: A | Discharge status: Home / Self Care | Payer: Medicare Other | Attending: Urology | Admitting: Urology

## 2022-02-12 ENCOUNTER — Ambulatory Visit (HOSPITAL_COMMUNITY): Payer: Medicare Other

## 2022-02-12 ENCOUNTER — Other Ambulatory Visit: Payer: Self-pay

## 2022-02-12 ENCOUNTER — Encounter (HOSPITAL_BASED_OUTPATIENT_CLINIC_OR_DEPARTMENT_OTHER)
Admission: RE | Disposition: A | Discharge status: Home / Self Care | Payer: Self-pay | Source: Home / Self Care | Attending: Urology

## 2022-02-12 ENCOUNTER — Encounter (HOSPITAL_BASED_OUTPATIENT_CLINIC_OR_DEPARTMENT_OTHER): Payer: Self-pay | Admitting: Urology

## 2022-02-12 DIAGNOSIS — E119 Type 2 diabetes mellitus without complications: Secondary | ICD-10-CM | POA: Diagnosis not present

## 2022-02-12 DIAGNOSIS — Z6830 Body mass index (BMI) 30.0-30.9, adult: Secondary | ICD-10-CM | POA: Insufficient documentation

## 2022-02-12 DIAGNOSIS — I10 Essential (primary) hypertension: Secondary | ICD-10-CM | POA: Diagnosis not present

## 2022-02-12 DIAGNOSIS — E669 Obesity, unspecified: Secondary | ICD-10-CM | POA: Insufficient documentation

## 2022-02-12 DIAGNOSIS — N201 Calculus of ureter: Secondary | ICD-10-CM | POA: Diagnosis present

## 2022-02-12 DIAGNOSIS — Z01818 Encounter for other preprocedural examination: Secondary | ICD-10-CM

## 2022-02-12 HISTORY — DX: Type 2 diabetes mellitus without complications: E11.9

## 2022-02-12 HISTORY — PX: EXTRACORPOREAL SHOCK WAVE LITHOTRIPSY: SHX1557

## 2022-02-12 LAB — GLUCOSE, CAPILLARY
Glucose-Capillary: 166 mg/dL — ABNORMAL HIGH (ref 70–99)
Glucose-Capillary: 108 mg/dL — ABNORMAL HIGH (ref 70–99)

## 2022-02-12 SURGERY — LITHOTRIPSY, ESWL
Anesthesia: LOCAL | Laterality: Left

## 2022-02-12 MED ORDER — DIAZEPAM 5 MG PO TABS
10.0000 mg | ORAL_TABLET | ORAL | Status: AC
  Administered 2022-02-12: 10 mg via ORAL

## 2022-02-12 MED ORDER — DIPHENHYDRAMINE HCL 25 MG PO CAPS
25.0000 mg | ORAL_CAPSULE | ORAL | Status: AC
  Administered 2022-02-12: 25 mg via ORAL

## 2022-02-12 MED ORDER — CIPROFLOXACIN HCL 500 MG PO TABS
500.0000 mg | ORAL_TABLET | ORAL | Status: AC
  Administered 2022-02-12: 500 mg via ORAL

## 2022-02-12 MED ORDER — CIPROFLOXACIN HCL 500 MG PO TABS
ORAL_TABLET | ORAL | Status: AC
  Filled 2022-02-12: qty 1

## 2022-02-12 MED ORDER — DIPHENHYDRAMINE HCL 25 MG PO CAPS
ORAL_CAPSULE | ORAL | Status: AC
  Filled 2022-02-12: qty 1

## 2022-02-12 MED ORDER — SODIUM CHLORIDE 0.9 % IV SOLN: INTRAVENOUS | Status: DC

## 2022-02-12 MED ORDER — DIAZEPAM 5 MG PO TABS
ORAL_TABLET | ORAL | Status: AC
  Filled 2022-02-12: qty 2

## 2022-02-12 NOTE — Discharge Instructions (Signed)

## 2022-02-12 NOTE — H&P (Signed)
Timothy [name] is an 67 y.o. male.    Chief Complaint: Pre-Op LEFT Shockwave Lithotripsy  HPI:   1 - LEFT Mid Ureteral Stone  - about 23mm stone at L5 transverse process level by KUB 01/2022 on eval flank pain with h/o reurrent stones. Had CT in [place] per report prior with non-obstructing large Rt renal stone in addition to left ureteral . Most recent UA without infectious parameters.  Today "Timothy [name]" is seen to proceed with LEFT shockwave lithotripsy. No interval fevers.   Past Medical History:  Diagnosis Date   Diabetes mellitus without complication ([place])    Kidney stones     Past Surgical History:  Procedure Laterality Date   CHOLECYSTECTOMY     HERNIA REPAIR     KNEE SURGERY      No family history on file. Social History:  reports that he has never smoked. He has never used smokeless tobacco. He reports current alcohol use. He reports that he does not use drugs.  Allergies: No Known Allergies  No medications prior to admission.    No results found for this or any previous visit (from the past 48 hour(s)). No results found.  Review of Systems  Constitutional:  Negative for chills and fatigue.  All other systems reviewed and are negative.   There were no vitals taken for this visit. Physical Exam Vitals reviewed.  Eyes:     Pupils: Pupils are equal, round, and reactive to light.  Cardiovascular:     Rate and Rhythm: Normal rate.  Pulmonary:     Effort: Pulmonary effort is normal.  Abdominal:     General: Abdomen is flat.  Genitourinary:    Comments: Minimal left CVAT at present Musculoskeletal:        General: Normal range of motion.     Cervical back: Normal range of motion.  Skin:    General: Skin is warm.  Neurological:     Mental Status: He is alert.  Psychiatric:        Mood and Affect: Mood normal.      Assessment/Plan  Proceed as planned with LEFT shockwave lithotripsy. Risks, benefits, alternatives, expected peri-op course discussed in detail.    Alexis Frock, MD 02/12/2022, 7:54 AM

## 2022-02-12 NOTE — Brief Op Note (Signed)
02/12/2022  12:39 PM  PATIENT:  Timothy [name]  67 y.o. male  PRE-OPERATIVE DIAGNOSIS:  URETERAL STONE  POST-OPERATIVE DIAGNOSIS:  * No post-op diagnosis entered *  PROCEDURE:  Procedure(s): EXTRACORPOREAL SHOCK WAVE LITHOTRIPSY (ESWL) (Left)  SURGEON:  Surgeon(s) and Role:    * Alexis Frock, MD - Primary  PHYSICIAN ASSISTANT:   ASSISTANTS: none   ANESTHESIA:   MAC  EBL:  minimal   BLOOD ADMINISTERED:none  DRAINS: none   LOCAL MEDICATIONS USED:  NONE  SPECIMEN:  No Specimen  DISPOSITION OF SPECIMEN:  N/A  COUNTS:  YES  TOURNIQUET:  * No tourniquets in log *  DICTATION: .Note written in paper chart  PLAN OF CARE: Discharge to home after PACU  PATIENT DISPOSITION:  Short Stay   Delay start of Pharmacological VTE agent (>24hrs) due to surgical blood loss or risk of bleeding: not applicable

## 2022-02-13 ENCOUNTER — Encounter (HOSPITAL_BASED_OUTPATIENT_CLINIC_OR_DEPARTMENT_OTHER): Payer: Self-pay | Admitting: Urology

## 2023-11-15 DIAGNOSIS — E1165 Type 2 diabetes mellitus with hyperglycemia: Secondary | ICD-10-CM | POA: Diagnosis not present
